# Patient Record
Sex: Female | Born: 1985 | Race: Black or African American | Hispanic: No | Marital: Single | Smoking: Former smoker
Health system: Southern US, Community
[De-identification: ages and names within clinical notes are randomized; demographics above are authoritative.]

## PROBLEM LIST (undated history)

## (undated) DIAGNOSIS — D649 Anemia, unspecified: Secondary | ICD-10-CM

## (undated) DIAGNOSIS — N200 Calculus of kidney: Secondary | ICD-10-CM

---

## 2003-05-19 ENCOUNTER — Emergency Department (HOSPITAL_COMMUNITY): Admission: EM | Admit: 2003-05-19 | Discharge: 2003-05-20 | Payer: Self-pay | Admitting: Emergency Medicine

## 2004-05-13 ENCOUNTER — Inpatient Hospital Stay (HOSPITAL_COMMUNITY): Admission: AD | Admit: 2004-05-13 | Discharge: 2004-05-13 | Payer: Self-pay | Admitting: Obstetrics

## 2004-07-16 ENCOUNTER — Ambulatory Visit (HOSPITAL_COMMUNITY): Admission: RE | Admit: 2004-07-16 | Discharge: 2004-07-16 | Payer: Self-pay | Admitting: Obstetrics & Gynecology

## 2004-09-03 ENCOUNTER — Inpatient Hospital Stay (HOSPITAL_COMMUNITY): Admission: AD | Admit: 2004-09-03 | Discharge: 2004-09-03 | Payer: Self-pay | Admitting: Obstetrics

## 2004-09-22 ENCOUNTER — Inpatient Hospital Stay (HOSPITAL_COMMUNITY): Admission: AD | Admit: 2004-09-22 | Discharge: 2004-09-22 | Payer: Self-pay | Admitting: Obstetrics

## 2004-11-06 ENCOUNTER — Inpatient Hospital Stay (HOSPITAL_COMMUNITY): Admission: AD | Admit: 2004-11-06 | Discharge: 2004-11-09 | Payer: Self-pay | Admitting: Obstetrics & Gynecology

## 2004-12-09 ENCOUNTER — Inpatient Hospital Stay (HOSPITAL_COMMUNITY): Admission: AD | Admit: 2004-12-09 | Discharge: 2004-12-09 | Payer: Self-pay | Admitting: Obstetrics & Gynecology

## 2008-05-29 ENCOUNTER — Emergency Department (HOSPITAL_COMMUNITY): Admission: EM | Admit: 2008-05-29 | Discharge: 2008-05-29 | Payer: Self-pay | Admitting: Family Medicine

## 2008-07-05 ENCOUNTER — Emergency Department (HOSPITAL_COMMUNITY): Admission: EM | Admit: 2008-07-05 | Discharge: 2008-07-05 | Payer: Self-pay | Admitting: Family Medicine

## 2008-10-21 ENCOUNTER — Emergency Department (HOSPITAL_COMMUNITY): Admission: EM | Admit: 2008-10-21 | Discharge: 2008-10-21 | Payer: Self-pay | Admitting: Emergency Medicine

## 2010-03-27 ENCOUNTER — Inpatient Hospital Stay (INDEPENDENT_AMBULATORY_CARE_PROVIDER_SITE_OTHER)
Admission: RE | Admit: 2010-03-27 | Discharge: 2010-03-27 | Disposition: A | Discharge status: Home / Self Care | Payer: Self-pay | Source: Ambulatory Visit | Attending: Family Medicine | Admitting: Family Medicine

## 2010-03-27 DIAGNOSIS — J020 Streptococcal pharyngitis: Secondary | ICD-10-CM

## 2011-10-22 ENCOUNTER — Encounter (HOSPITAL_COMMUNITY): Payer: Self-pay | Admitting: Emergency Medicine

## 2011-10-22 ENCOUNTER — Emergency Department (HOSPITAL_COMMUNITY): Payer: Self-pay

## 2011-10-22 ENCOUNTER — Emergency Department (HOSPITAL_COMMUNITY)
Admission: EM | Admit: 2011-10-22 | Discharge: 2011-10-22 | Disposition: A | Discharge status: Home / Self Care | Payer: Self-pay | Attending: Emergency Medicine | Admitting: Emergency Medicine

## 2011-10-22 DIAGNOSIS — N23 Unspecified renal colic: Secondary | ICD-10-CM | POA: Insufficient documentation

## 2011-10-22 DIAGNOSIS — N1330 Unspecified hydronephrosis: Secondary | ICD-10-CM | POA: Insufficient documentation

## 2011-10-22 LAB — URINALYSIS, ROUTINE W REFLEX MICROSCOPIC
Bilirubin Urine: NEGATIVE
Glucose, UA: NEGATIVE mg/dL
Ketones, ur: NEGATIVE mg/dL
Nitrite: NEGATIVE
Protein, ur: 30 mg/dL — AB
pH: 6 (ref 5.0–8.0)

## 2011-10-22 LAB — POCT PREGNANCY, URINE: Preg Test, Ur: NEGATIVE

## 2011-10-22 LAB — URINE MICROSCOPIC-ADD ON

## 2011-10-22 MED ORDER — MORPHINE SULFATE 4 MG/ML IJ SOLN
4.0000 mg | Freq: Once | INTRAMUSCULAR | Status: AC
  Administered 2011-10-22: 06:00:00 via INTRAVENOUS

## 2011-10-22 MED ORDER — ONDANSETRON HCL 4 MG/2ML IJ SOLN
INTRAMUSCULAR | Status: AC
  Filled 2011-10-22: qty 2

## 2011-10-22 MED ORDER — HYDROMORPHONE HCL PF 1 MG/ML IJ SOLN
1.0000 mg | Freq: Once | INTRAMUSCULAR | Status: AC
  Administered 2011-10-22: 1 mg via INTRAVENOUS
  Filled 2011-10-22: qty 1

## 2011-10-22 MED ORDER — ONDANSETRON HCL 4 MG/2ML IJ SOLN
4.0000 mg | Freq: Once | INTRAMUSCULAR | Status: AC
  Administered 2011-10-22: 4 mg via INTRAVENOUS

## 2011-10-22 MED ORDER — KETOROLAC TROMETHAMINE 30 MG/ML IJ SOLN
30.0000 mg | Freq: Once | INTRAMUSCULAR | Status: AC
  Administered 2011-10-22: 30 mg via INTRAVENOUS
  Filled 2011-10-22: qty 1

## 2011-10-22 MED ORDER — OXYCODONE-ACETAMINOPHEN 5-325 MG PO TABS: 1.0000 | ORAL_TABLET | Freq: Once | ORAL | Status: DC

## 2011-10-22 MED ORDER — PROMETHAZINE HCL 25 MG PO TABS: 25.0000 mg | ORAL_TABLET | Freq: Four times a day (QID) | ORAL | Status: DC | PRN

## 2011-10-22 MED ORDER — ONDANSETRON 4 MG PO TBDP: 4.0000 mg | ORAL_TABLET | Freq: Once | ORAL | Status: DC

## 2011-10-22 MED ORDER — HYDROMORPHONE HCL 4 MG PO TABS: 4.0000 mg | ORAL_TABLET | ORAL | Status: AC | PRN

## 2011-10-22 NOTE — ED Notes (Signed)
PT. WOKE UP THIS MORNING WITH LEFT FLANK PAIN , URINARY FREQUENCY ONSET 2 DAYS AGO , VOMITTED AT TRIAGE .

## 2011-10-22 NOTE — ED Provider Notes (Cosign Needed Addendum)
History     CSN: [id number]  Arrival date & time 10/22/11  0457   First MD Initiated Contact with Patient 10/22/11 [phone]      Chief Complaint  Patient presents with  . Flank Pain    (Consider location/radiation/quality/duration/timing/severity/associated sxs/prior treatment) Patient is a 26 y.o. female presenting with flank pain. The history is provided by the patient. The history is limited by the condition of the patient.  Flank Pain Associated symptoms include abdominal pain.   26 year old, female, complains of sudden onset of left-sided flank pain, associated with nausea, and vomiting.  She denies respiratory symptoms.  She has not had these symptoms in the past.  Level V caveat applies for severe pain and urgent need for intervention  History reviewed. No pertinent past medical history.  History reviewed. No pertinent past surgical history.  No family history on file.  History  Substance Use Topics  . Smoking status: Never Smoker   . Smokeless tobacco: Not on file  . Alcohol Use: Yes    OB History    Grav Para Term Preterm Abortions TAB SAB Ect Mult Living                  Review of Systems  Constitutional: Negative for fever and diaphoresis.  Gastrointestinal: Positive for nausea, vomiting and abdominal pain. Negative for diarrhea.  Genitourinary: Positive for flank pain.  All other systems reviewed and are negative.    Allergies  Review of patient's allergies indicates no known allergies.  Home Medications  No current outpatient prescriptions on file.  BP 120/65  Pulse 89  Temp 98.2 F (36.8 C) (Oral)  Resp 20  SpO2 100%  Physical Exam  Nursing note and vitals reviewed. Constitutional: She is oriented to person, place, and time. She appears well-developed and well-nourished. She appears distressed.       Rocking back-and-forth in pain holding the right side of her abdomen  HENT:  Head: Normocephalic and atraumatic.  Eyes: Conjunctivae are normal.   Neck: Normal range of motion.  Cardiovascular: Normal rate and regular rhythm.   Pulmonary/Chest: Effort normal and breath sounds normal.  Abdominal: She exhibits no distension.  Musculoskeletal: Normal range of motion.  Neurological: She is alert and oriented to person, place, and time.  Skin: Skin is warm and dry.  Psychiatric: She has a normal mood and affect. Thought content normal.    ED Course  Procedures (including critical care time) 26 year old, female, with sudden onset of left sided abdominal pain, and right flank pain, with nausea, and vomiting.  She is rocking back and forth holding her abdomen.  I suspect.  She has a kidney stone.  We will give her IV analgesics, and antiemetics, and perform a CAT scan for further evaluation  Labs Reviewed  URINALYSIS, ROUTINE W REFLEX MICROSCOPIC - Abnormal; Notable for the following:    APPearance CLOUDY (*)     Hgb urine dipstick MODERATE (*)     Protein, ur 30 (*)     Leukocytes, UA SMALL (*)     All other components within normal limits  URINE MICROSCOPIC-ADD ON - Abnormal; Notable for the following:    Crystals CA OXALATE CRYSTALS (*)     All other components within normal limits  POCT PREGNANCY, URINE   No results found.   No diagnosis found.  7:04 AM Pain controlled.  Feels as if she can go home.    MDM  Flank pain Left hydroureter.  Cheri Guppy, MD 10/22/11 1610  Cheri Guppy, MD 10/22/11 0700  Cheri Guppy, MD 10/22/11 [phone]

## 2011-10-22 NOTE — ED Notes (Signed)
IV d/c'd placed by prior shift, catheter intact

## 2011-10-22 NOTE — Discharge Instructions (Signed)
Use ibuprofen 600 mg and Dilaudid for recurrent pain.  Use Phenergan for nausea and vomiting.  Followup with the urologist, for reevaluation.  If your symptoms.  Last more than one to 2 days.  Return for worse or uncontrolled symptoms   .Ureteral Colic (Kidney Stones) Ureteral colic is the result of a condition when kidney stones form inside the kidney. Once kidney stones are formed they may move into the tube that connects the kidney with the bladder (ureter). If this occurs, this condition may cause pain (colic) in the ureter.  CAUSES  Pain is caused by stone movement in the ureter and the obstruction caused by the stone. SYMPTOMS  The pain comes and goes as the ureter contracts around the stone. The pain is usually intense, sharp, and stabbing in character. The location of the pain may move as the stone moves through the ureter. When the stone is near the kidney the pain is usually located in the back and radiates to the belly (abdomen). When the stone is ready to pass into the bladder the pain is often located in the lower abdomen on the side the stone is located. At this location, the symptoms may mimic those of a urinary tract infection with urinary frequency. Once the stone is located here it often passes into the bladder and the pain disappears completely. TREATMENT   Your caregiver will provide you with medicine for pain relief.   You may require specialized follow-up X-rays.   The absence of pain does not always mean that the stone has passed. It may have just stopped moving. If the urine remains completely obstructed, it can cause loss of kidney function or even complete destruction of the involved kidney. It is your responsibility and in your interest that X-rays and follow-ups as suggested by your caregiver are completed. Relief of pain without passage of the stone can be associated with severe damage to the kidney, including loss of kidney function on that side.   If your stone does  not pass on its own, additional measures may be taken by your caregiver to ensure its removal.  HOME CARE INSTRUCTIONS   Increase your fluid intake. Water is the preferred fluid since juices containing vitamin C may acidify the urine making it less likely for certain stones (uric acid stones) to pass.   Strain all urine. A strainer will be provided. Keep all particulate matter or stones for your caregiver to inspect.   Take your pain medicine as directed.   Make a follow-up appointment with your caregiver as directed.   Remember that the goal is passage of your stone. The absence of pain does not mean the stone is gone. Follow your caregiver's instructions.   Only take over-the-counter or prescription medicines for pain, discomfort, or fever as directed by your caregiver.  SEEK MEDICAL CARE IF:   Pain cannot be controlled with the prescribed medicine.   You have a fever.   Pain continues for longer than your caregiver advises it should.   There is a change in the pain, and you develop chest discomfort or constant abdominal pain.   You feel faint or pass out.  MAKE SURE YOU:   Understand these instructions.   Will watch your condition.   Will get help right away if you are not doing well or get worse.  Document Released: 11/13/2004 Document Revised: 01/23/2011 Document Reviewed: 07/31/2010 [facility] Patient Information 2012 [place], [place].

## 2011-10-22 NOTE — ED Notes (Signed)
Took out PIV of left forearm

## 2013-03-18 ENCOUNTER — Emergency Department (HOSPITAL_COMMUNITY)
Admission: EM | Admit: 2013-03-18 | Discharge: 2013-03-18 | Disposition: A | Discharge status: Home / Self Care | Payer: BC Managed Care – PPO | Source: Home / Self Care

## 2013-03-18 ENCOUNTER — Encounter (HOSPITAL_COMMUNITY): Payer: Self-pay | Admitting: Emergency Medicine

## 2013-03-18 DIAGNOSIS — J069 Acute upper respiratory infection, unspecified: Secondary | ICD-10-CM

## 2013-03-18 MED ORDER — GUAIFENESIN-CODEINE 100-10 MG/5ML PO SYRP: 10.0000 mL | ORAL_SOLUTION | Freq: Four times a day (QID) | ORAL | Status: DC | PRN

## 2013-03-18 MED ORDER — IPRATROPIUM BROMIDE 0.06 % NA SOLN: 2.0000 | Freq: Four times a day (QID) | NASAL | Status: DC

## 2013-03-18 NOTE — ED Notes (Signed)
coughin since yesterday, throat scratchy, pain beneath both ears, runny nose, and no fever, no nausea, no vomiting, no diarrhea

## 2013-03-18 NOTE — Discharge Instructions (Signed)
Drink plenty of fluids as discussed, use medicine as prescribed, and mucinex or delsym for cough. Return or see your doctor if further problems °

## 2013-03-18 NOTE — ED Provider Notes (Signed)
CSN: [id number]     Arrival date & time 03/18/13  1806 History   None    Chief Complaint  Patient presents with  . URI   (Consider location/radiation/quality/duration/timing/severity/associated sxs/prior Treatment) Patient is a 28 y.o. female presenting with URI. The history is provided by the patient.  URI Presenting symptoms: congestion, cough and rhinorrhea   Presenting symptoms: no fever   Severity:  Mild Onset quality:  Gradual Duration:  1 day Progression:  Unchanged Chronicity:  New Associated symptoms: no myalgias   Risk factors: sick contacts     History reviewed. No pertinent past medical history. History reviewed. No pertinent past surgical history. No family history on file. History  Substance Use Topics  . Smoking status: Never Smoker   . Smokeless tobacco: Not on file  . Alcohol Use: Yes   OB History   Grav Para Term Preterm Abortions TAB SAB Ect Mult Living                 Review of Systems  Constitutional: Negative for fever and chills.  HENT: Positive for congestion, postnasal drip and rhinorrhea.   Respiratory: Positive for cough. Negative for shortness of breath.   Cardiovascular: Negative.   Gastrointestinal: Negative.   Musculoskeletal: Negative for myalgias.    Allergies  Review of patient's allergies indicates no known allergies.  Home Medications   Current Outpatient Rx  Name  Route  Sig  Dispense  Refill  . Pseudoephedrine-APAP-DM (DAYQUIL PO)   Oral   Take by mouth.         Marland Kitchen. guaiFENesin-codeine (ROBITUSSIN AC) 100-10 MG/5ML syrup   Oral   Take 10 mLs by mouth 4 (four) times daily as needed for cough.   180 mL   0   . ipratropium (ATROVENT) 0.06 % nasal spray   Each Nare   Place 2 sprays into both nostrils 4 (four) times daily.   15 mL   2   . EXPIRED: promethazine (PHENERGAN) 25 MG tablet   Oral   Take 1 tablet (25 mg total) by mouth every 6 (six) hours as needed for nausea.   20 tablet   0    BP 120/80  Pulse 88   Temp(Src) 98.3 F (36.8 C) (Oral)  Resp 16  SpO2 100% Physical Exam  Nursing note and vitals reviewed. Constitutional: She is oriented to person, place, and time. She appears well-developed and well-nourished.  HENT:  Head: Normocephalic.  Right Ear: External ear normal.  Left Ear: External ear normal.  Mouth/Throat: Oropharynx is clear and moist.  Eyes: Pupils are equal, round, and reactive to light.  Neck: Normal range of motion. Neck supple.  Cardiovascular: Normal rate, regular rhythm, normal heart sounds and intact distal pulses.   Pulmonary/Chest: Effort normal and breath sounds normal.  Lymphadenopathy:    She has no cervical adenopathy.  Neurological: She is alert and oriented to person, place, and time.  Skin: Skin is warm and dry.    ED Course  Procedures (including critical care time) Labs Review Labs Reviewed - No data to display Imaging Review No results found.    MDM      Linna HoffJames D Caryl Manas, MD 03/30/13 2228

## 2013-10-14 ENCOUNTER — Ambulatory Visit (INDEPENDENT_AMBULATORY_CARE_PROVIDER_SITE_OTHER): Payer: Federal, State, Local not specified - PPO | Admitting: Physician Assistant

## 2013-10-14 VITALS — BP 108/68 | HR 71 | Temp 98.7°F | Resp 17 | Ht 66.5 in | Wt 134.0 lb

## 2013-10-14 DIAGNOSIS — H00013 Hordeolum externum right eye, unspecified eyelid: Secondary | ICD-10-CM

## 2013-10-14 DIAGNOSIS — H00019 Hordeolum externum unspecified eye, unspecified eyelid: Secondary | ICD-10-CM

## 2013-10-14 NOTE — Patient Instructions (Signed)
Warm compresses. Baby shampoo - johnson-johnson

## 2013-10-14 NOTE — Progress Notes (Signed)
   Subjective:    Patient ID: Meghan [name], female    DOB: [birth year], 28 y.o.   MRN: [id number]  HPI Pt presents to clinic with 4 day h/o swelling on right upper eyelid that seems to be getting worse.  She tried some warm compresses but does not think they are helping.  She is having no pain in her eyeball and no vision changes for discharge from her eye.  She otherwise feels fine. She came in today because she got a small know near her right ear that is tender.  She wears glasses.  Review of Systems  Eyes: Negative for photophobia, pain, discharge, redness, itching and visual disturbance.       Objective:   Physical Exam  Vitals reviewed. Constitutional: She appears well-developed and well-nourished.  HENT:  Head: Normocephalic and atraumatic.  Right Ear: External ear normal.  Eyes: Conjunctivae and EOM are normal. Pupils are equal, round, and reactive to light. Right eye exhibits hordeolum. Left eye exhibits no chemosis, no discharge, no exudate and no hordeolum. No foreign body present in the left eye. Right conjunctiva is not injected. Right conjunctiva has no hemorrhage. Left conjunctiva is not injected. Left conjunctiva has no hemorrhage. No scleral icterus.    Slight inversion of eyelid did not reveal a pore or pustule.  Neck: Normal range of motion.  Pulmonary/Chest: Effort normal.  Lymphadenopathy:       Head (right side): Preauricular adenopathy present.       Head (left side): No preauricular adenopathy present.  Skin: Skin is warm and dry.  Psychiatric: She has a normal mood and affect. Her behavior is normal. Judgment and thought content normal.       Assessment & Plan:  Hordeolum externum (stye), right  This is most likely a stye but because it is slightly off the eyelid border we will have a low threshold for possible infected chlazion. For the next 48 hrs pt is going to be aggressive with warm compresses and gentle friction with baby shampoo washes.  If after  48 hrs she is not improving we will start oral abx and then after 48h if no better we will do a referral to ophthamology for evaluation because she is leaving town in 6 days.  She understands the plan and agrees.  Benny Lennert PA-C  [facility] 10/14/2013 7:37 PM

## 2013-10-16 ENCOUNTER — Telehealth: Payer: Self-pay

## 2013-10-16 ENCOUNTER — Encounter: Payer: Self-pay | Admitting: Physician Assistant

## 2013-10-16 MED ORDER — AMOXICILLIN 875 MG PO TABS: 875.0000 mg | ORAL_TABLET | Freq: Two times a day (BID) | ORAL | Status: DC

## 2013-10-16 NOTE — Telephone Encounter (Signed)
Patient was advised to call our office if warm compresses and baby shampoo did not cause improvement with her stye. Patient says Meghan [name] said she would call in eye drops

## 2013-10-16 NOTE — Telephone Encounter (Signed)
See email. Pt notified that abx was sent to pharm.

## 2013-10-16 NOTE — Telephone Encounter (Signed)
I am so sorry it has not gotten any better.  Lets try an oral abx to see if we can get it to get better faster - if no better in 48h please call me for a referral.

## 2013-10-24 ENCOUNTER — Encounter: Payer: Self-pay | Admitting: Physician Assistant

## 2013-10-24 DIAGNOSIS — H00019 Hordeolum externum unspecified eye, unspecified eyelid: Secondary | ICD-10-CM

## 2013-10-25 NOTE — Telephone Encounter (Signed)
Pt email to request referral. Placed order per last OV with Benny Lennert

## 2013-12-02 ENCOUNTER — Other Ambulatory Visit: Payer: Self-pay

## 2014-06-11 ENCOUNTER — Emergency Department (INDEPENDENT_AMBULATORY_CARE_PROVIDER_SITE_OTHER)
Admission: EM | Admit: 2014-06-11 | Discharge: 2014-06-11 | Disposition: A | Discharge status: Home / Self Care | Payer: Federal, State, Local not specified - PPO | Source: Home / Self Care

## 2014-06-11 ENCOUNTER — Encounter (HOSPITAL_COMMUNITY): Payer: Self-pay | Admitting: Emergency Medicine

## 2014-06-11 DIAGNOSIS — B349 Viral infection, unspecified: Secondary | ICD-10-CM

## 2014-06-11 MED ORDER — HYDROCODONE-HOMATROPINE 5-1.5 MG/5ML PO SYRP
5.0000 mL | ORAL_SOLUTION | Freq: Four times a day (QID) | ORAL | Status: DC | PRN
Start: 2014-06-11 — End: 2018-09-16

## 2014-06-11 NOTE — ED Provider Notes (Signed)
CSN: [id number]     Arrival date & time 06/11/14  4098 History   None    Chief Complaint  Patient presents with  . URI   (Consider location/radiation/quality/duration/timing/severity/associated sxs/prior Treatment)  HPI   Patient is a 29 year old female presenting today with complaints of bodyaches sweating and a "phlegmy cough", along with a low-grade fever for the past 2 days. Patient states she has been taking over-the-counter cough medicine Robitussin-DM and intermittently ibuprofen without relief. Last dose of ibuprofen was 8:00 yesterday evening. She states she states she came today is that she is not happy with "all of the coughing" and "never gets sick".  History reviewed. No pertinent past medical history. History reviewed. No pertinent past surgical history. Family History  Problem Relation Age of Onset  . Hyperlipidemia Father    History  Substance Use Topics  . Smoking status: Never Smoker   . Smokeless tobacco: Not on file  . Alcohol Use: Yes   OB History    No data available     Review of Systems  Constitutional: Positive for fever, chills, appetite change and fatigue.  HENT: Positive for congestion. Negative for ear pain, sinus pressure and sore throat.   Eyes: Negative.  Negative for photophobia.  Cardiovascular: Negative.   Gastrointestinal: Negative.  Negative for nausea, vomiting, abdominal pain and diarrhea.  Endocrine: Negative.   Genitourinary: Negative.   Musculoskeletal: Positive for myalgias. Negative for neck pain and neck stiffness.  Skin: Negative.  Negative for rash.  Allergic/Immunologic: Negative.   Hematological: Negative.   Psychiatric/Behavioral: Negative.     Allergies  Review of patient's allergies indicates no known allergies.  Home Medications   Prior to Admission medications   Medication Sig Start Date End Date Taking? Authorizing Provider  amoxicillin (AMOXIL) 875 MG tablet Take 1 tablet (875 mg total) by mouth 2 (two) times  daily. 10/16/13   Morrell Riddle, PA-C  HYDROcodone-homatropine (HYCODAN) 5-1.5 MG/5ML syrup Take 5 mLs by mouth every 6 (six) hours as needed for cough. 06/11/14   Servando Salina, NP   BP 116/75 mmHg  Pulse 72  Temp(Src) 99 F (37.2 C) (Oral)  Resp 16  SpO2 98%   Physical Exam  Constitutional: She appears well-developed and well-nourished. No distress.  HENT:  Head: Normocephalic and atraumatic.  Bilateral tympanic membranes pearly gray in appearance with light reflexes present and bony prominences visualized.  Nares patent.  Moderate swelling of turbinates. Posterior oropharynx with mild erythema. Mucoid discharge noted. No cobblestoning. No evidence of exudate or patches.    Eyes: Pupils are equal, round, and reactive to light. Right eye exhibits no discharge. Left eye exhibits no discharge.  Neck: Normal range of motion. Neck supple.  Negative for nuchal rigidity.  Cardiovascular: Normal rate, regular rhythm, normal heart sounds and intact distal pulses.  Exam reveals no gallop and no friction rub.   No murmur heard. Pulmonary/Chest: Effort normal and breath sounds normal. No respiratory distress. She has no wheezes. She has no rales. She exhibits no tenderness.  No adventitious breath sounds noted  Lymphadenopathy:    She has no cervical adenopathy.  Skin: She is not diaphoretic.  Nursing note and vitals reviewed. The does not appear to be particularly ill. Patient able to take good full deep breaths without coughing or any distress.  ED Course  Procedures (including critical care time) Labs Review Labs Reviewed - No data to display  Imaging Review No results found.   MDM   1. Viral illness  Meds ordered this encounter  Medications  . HYDROcodone-homatropine (HYCODAN) 5-1.5 MG/5ML syrup    Sig: Take 5 mLs by mouth every 6 (six) hours as needed for cough.    Dispense:  120 mL    Refill:  0   Discussed patient needed to try regular supportive measures such as  fluid, rest and over-the-counter Tylenol or ibuprofen as needed. Patient requests prescription cough medicine to help her rest. The patient verbalizes understanding and agrees to plan of care.       Servando Salinaatherine H Rossi, NP 06/11/14 1213

## 2014-06-11 NOTE — Discharge Instructions (Signed)
Cough, Adult  A cough is a reflex that helps clear your throat and airways. It can help heal the body or may be a reaction to an irritated airway. A cough may only last 2 or 3 weeks (acute) or may last more than 8 weeks (chronic).  CAUSES Acute cough:  Viral or bacterial infections. Chronic cough:  Infections.  Allergies.  Asthma.  Post-nasal drip.  Smoking.  Heartburn or acid reflux.  Some medicines.  Chronic lung problems (COPD).  Cancer. SYMPTOMS   Cough.  Fever.  Chest pain.  Increased breathing rate.  High-pitched whistling sound when breathing (wheezing).  Colored mucus that you cough up (sputum). TREATMENT   A bacterial cough may be treated with antibiotic medicine.  A viral cough must run its course and will not respond to antibiotics.  Your caregiver may recommend other treatments if you have a chronic cough. HOME CARE INSTRUCTIONS   Only take over-the-counter or prescription medicines for pain, discomfort, or fever as directed by your caregiver. Use cough suppressants only as directed by your caregiver.  Use a cold steam vaporizer or humidifier in your bedroom or home to help loosen secretions.  Sleep in a semi-upright position if your cough is worse at night.  Rest as needed.  Stop smoking if you smoke. SEEK IMMEDIATE MEDICAL CARE IF:   You have pus in your sputum.  Your cough starts to worsen.  You cannot control your cough with suppressants and are losing sleep.  You begin coughing up blood.  You have difficulty breathing.  You develop pain which is getting worse or is uncontrolled with medicine.  You have a fever. MAKE SURE YOU:   Understand these instructions.  Will watch your condition.  Will get help right away if you are not doing well or get worse. Document Released: 08/02/2010 Document Revised: 04/28/2011 Document Reviewed: 08/02/2010 [employer] Patient Information 2015 [employer]. This information is not intended  to replace advice given to you by your health care provider. Make sure you discuss any questions you have with your health care provider.  

## 2014-06-11 NOTE — ED Notes (Signed)
C/o  Productive cough with clear sputum.  Low grade temp.  Body aches.  Chills.  Symptoms present since Friday.  Denies n/v/d.   No relief with otc meds.

## 2018-09-16 ENCOUNTER — Other Ambulatory Visit: Payer: Self-pay

## 2018-09-16 ENCOUNTER — Ambulatory Visit (HOSPITAL_COMMUNITY)
Admission: EM | Admit: 2018-09-16 | Discharge: 2018-09-16 | Disposition: A | Discharge status: Home / Self Care | Payer: Federal, State, Local not specified - PPO | Attending: Family Medicine | Admitting: Family Medicine

## 2018-09-16 ENCOUNTER — Encounter (HOSPITAL_COMMUNITY): Payer: Self-pay

## 2018-09-16 DIAGNOSIS — R109 Unspecified abdominal pain: Secondary | ICD-10-CM | POA: Diagnosis not present

## 2018-09-16 DIAGNOSIS — R8281 Pyuria: Secondary | ICD-10-CM

## 2018-09-16 HISTORY — DX: Calculus of kidney: N20.0

## 2018-09-16 LAB — POCT URINALYSIS DIP (DEVICE)
Bilirubin Urine: NEGATIVE
Glucose, UA: NEGATIVE mg/dL
Hgb urine dipstick: NEGATIVE
Ketones, ur: NEGATIVE mg/dL
Nitrite: NEGATIVE
Protein, ur: NEGATIVE mg/dL
Specific Gravity, Urine: 1.025 (ref 1.005–1.030)
Urobilinogen, UA: 0.2 mg/dL (ref 0.0–1.0)
pH: 6.5 (ref 5.0–8.0)

## 2018-09-16 MED ORDER — CEPHALEXIN 500 MG PO CAPS: 500.0000 mg | ORAL_CAPSULE | Freq: Four times a day (QID) | ORAL | 0 refills | Status: DC

## 2018-09-16 MED ORDER — KETOROLAC TROMETHAMINE 60 MG/2ML IM SOLN
60.0000 mg | Freq: Once | INTRAMUSCULAR | Status: AC
  Administered 2018-09-16: 60 mg via INTRAMUSCULAR

## 2018-09-16 MED ORDER — KETOROLAC TROMETHAMINE 60 MG/2ML IM SOLN
INTRAMUSCULAR | Status: AC
  Filled 2018-09-16: qty 2

## 2018-09-16 NOTE — ED Triage Notes (Signed)
Pt presents with left side flank pain since Monday.  Pt has history of kidney stones

## 2018-09-16 NOTE — ED Provider Notes (Signed)
[facility]    CSN: [id number] Arrival date & time: 09/16/18  1824     History   Chief Complaint Chief Complaint  Patient presents with   Flank Pain    HPI Meghan [name] is a 33 y.o. female.   33 yo woman, initial [facility] patient, presents for evaluation for left kidney stone because of flank pain x 3 days.  She has a h/o left kidney stone in the past(2013).  She was awakened by left flank pain on Monday.  The pain is positional and she has some nausea.  No vomiting or fever.  Patient works from home.     Past Medical History:  Diagnosis Date   Kidney stones     There are no active problems to display for this patient.   History reviewed. No pertinent surgical history.  OB History   No obstetric history on file.      Home Medications    Prior to Admission medications   Medication Sig Start Date End Date Taking? Authorizing Provider  cephALEXin (KEFLEX) 500 MG capsule Take 1 capsule (500 mg total) by mouth 4 (four) times daily. 09/16/18   Elvina SidleLauenstein, Ozie Dimaria, MD    Family History Family History  Problem Relation Age of Onset   Hyperlipidemia Father     Social History Social History   Tobacco Use   Smoking status: Never Smoker  Substance Use Topics   Alcohol use: Yes   Drug use: No     Allergies   Patient has no known allergies.   Review of Systems Review of Systems  Constitutional: Negative.   Gastrointestinal: Positive for nausea. Negative for vomiting.  Genitourinary: Positive for flank pain. Negative for hematuria.     Physical Exam Triage Vital Signs ED Triage Vitals  Enc Vitals Group     BP      Pulse      Resp      Temp      Temp src      SpO2      Weight      Height      Head Circumference      Peak Flow      Pain Score      Pain Loc      Pain Edu?      Excl. in GC?    No data found.  Updated Vital Signs BP 125/81 (BP Location: Right Arm)    Pulse 66    Temp 99 F (37.2 C) (Oral)    Resp 18     SpO2 99%   Physical Exam Vitals signs and nursing note reviewed.  Constitutional:      Appearance: Normal appearance.  HENT:     Head: Normocephalic.     Mouth/Throat:     Pharynx: Oropharynx is clear.  Eyes:     Conjunctiva/sclera: Conjunctivae normal.  Neck:     Musculoskeletal: Normal range of motion and neck supple.  Pulmonary:     Effort: Pulmonary effort is normal.  Abdominal:     Tenderness: There is left CVA tenderness.  Musculoskeletal: Normal range of motion.  Skin:    General: Skin is warm and dry.  Neurological:     General: No focal deficit present.     Mental Status: She is alert.  Psychiatric:        Mood and Affect: Mood normal.      UC Treatments / Results  Labs (all labs ordered are listed, but only abnormal results  are displayed) Labs Reviewed  POCT URINALYSIS DIP (DEVICE) - Abnormal; Notable for the following components:      Result Value   Leukocytes,Ua TRACE (*)    All other components within normal limits  URINE CULTURE    EKG   Radiology *RADIOLOGY REPORT*  10/22/2011  Clinical Data: Left flank and back pain, nausea, vomiting, similar episode 4 days ago with hematuria, possible UTI or patient  CT ABDOMEN AND PELVIS WITHOUT CONTRAST  Technique:  Multidetector CT imaging of the abdomen and pelvis was performed following the standard protocol without intravenous contrast. Sagittal and coronal MPR images reconstructed from axial data set.  Comparison: None  Findings: Lung bases clear. Bilateral small nonobstructing renal calculi. Mild left hydronephrosis and proximal ureteral dilatation. Distally the left ureter is difficult to visualize due to lack of adequate fat planes. No definite distal ureteral calcification is identified. Within limits of a nonenhanced exam, no focal abnormalities of the liver, spleen, pancreas, right kidney, or adrenal glands identified. Left kidney appears mildly enlarged versus the right kidney  without focal mass.  Grossly unremarkable bladder, uterus and adnexae. Normal appendix. No gross abnormalities of large or small bowel loops are identified on exam limited by lack of IV and oral contrast. No definite mass, adenopathy, free fluid, or free air. No acute osseous findings.  IMPRESSION: Bilateral nonobstructing small renal calculi. Mild left hydronephrosis and proximal hydroureter without definite visualization of a ureteral calculus. Minimal asymmetric enlargement of left kidney versus right.   Original Report Authenticated By: Burnetta Sabin, M.D.  10/22/2011 Procedures Procedures (including critical care time)  Medications Ordered in UC Medications  ketorolac (TORADOL) injection 60 mg (has no administration in time range)    Initial Impression / Assessment and Plan / UC Course  I have reviewed the triage vital signs and the nursing notes.  Pertinent labs & imaging results that were available during my care of the patient were reviewed by me and considered in my medical decision making (see chart for details).    Final Clinical Impressions(s) / UC Diagnoses   Final diagnoses:  Pyuria  Acute left flank pain     Discharge Instructions     The urine shows no blood, but it does show some signs of infection.  We are running a culture.  I have electronically ordered an antibiotic for your to start tonight.  If the pain does not improve in 24 hours, or it worsens, go to the emergency room for imaging.    ED Prescriptions    Medication Sig Dispense Auth. Provider   cephALEXin (KEFLEX) 500 MG capsule Take 1 capsule (500 mg total) by mouth 4 (four) times daily. 20 capsule Robyn Haber, MD     Controlled Substance Prescriptions [place] Controlled Substance Registry consulted? Not Applicable   Robyn Haber, MD 09/16/18 [phone]

## 2018-09-16 NOTE — Discharge Instructions (Addendum)
The urine shows no blood, but it does show some signs of infection.  We are running a culture.  I have electronically ordered an antibiotic for your to start tonight.  If the pain does not improve in 24 hours, or it worsens, go to the emergency room for imaging.

## 2018-09-18 LAB — URINE CULTURE: Culture: NO GROWTH

## 2018-11-11 ENCOUNTER — Other Ambulatory Visit: Payer: Self-pay

## 2018-11-12 ENCOUNTER — Ambulatory Visit (INDEPENDENT_AMBULATORY_CARE_PROVIDER_SITE_OTHER): Payer: Federal, State, Local not specified - PPO | Admitting: Obstetrics & Gynecology

## 2018-11-12 ENCOUNTER — Telehealth: Payer: Self-pay | Admitting: *Deleted

## 2018-11-12 ENCOUNTER — Encounter: Payer: Self-pay | Admitting: Obstetrics & Gynecology

## 2018-11-12 VITALS — BP 118/76 | Ht 65.0 in | Wt 135.0 lb

## 2018-11-12 DIAGNOSIS — Z1151 Encounter for screening for human papillomavirus (HPV): Secondary | ICD-10-CM

## 2018-11-12 DIAGNOSIS — Z01419 Encounter for gynecological examination (general) (routine) without abnormal findings: Secondary | ICD-10-CM

## 2018-11-12 DIAGNOSIS — Z3009 Encounter for other general counseling and advice on contraception: Secondary | ICD-10-CM

## 2018-11-12 DIAGNOSIS — N920 Excessive and frequent menstruation with regular cycle: Secondary | ICD-10-CM | POA: Diagnosis not present

## 2018-11-12 DIAGNOSIS — N6320 Unspecified lump in the left breast, unspecified quadrant: Secondary | ICD-10-CM

## 2018-11-12 DIAGNOSIS — Z113 Encounter for screening for infections with a predominantly sexual mode of transmission: Secondary | ICD-10-CM | POA: Diagnosis not present

## 2018-11-12 NOTE — Telephone Encounter (Signed)
-----   Message from Princess Bruins, MD sent at 11/12/2018 11:55 AM EDT ----- Regarding: Schedule Left Dx Mammo/US Left breast nodule at 7 O'Clock: 1.5 x 2 cm, mobile, NT.  Lt Dx Mammo/US.

## 2018-11-12 NOTE — Progress Notes (Signed)
Meghan [name] 05/30/1985 [id number]   History:    33 y.o. G4P2A2L2  Children 105 and 59 yo.  Boyfriend x 8 months.    RP:  New patient presenting for annual gyn exam   HPI: Menses regular every month, but heavy flow x 7-8 days.  Long standing heavy menses previously treated with DepoProvera, took it x 6 yrs until 2018.  Tried again with Depo-Provera for 2 shots in 2019 but her bleeding did not stop, therefore she did not have additional injections.  Per patient, had a normal Pelvic US 12/2017.  Tried BCPs without success in controlling her heavy menstrual flow.  No pelvic pain currently.  Normal vaginal secretions.  No pain with intercourse.  Using condoms.  Urine and bowel movements normal.  Breasts normal.  Body mass index 22.47.  Good fitness.  Healthy nutrition.  Past medical history,surgical history, family history and social history were all reviewed and documented in the EPIC chart.  Gynecologic History Patient's last menstrual period was 11/05/2018. Contraception: condoms Last Pap: 2019. Results were: normal per patient Last mammogram: Never Bone Density: Never Colonoscopy: Never  Obstetric History OB History  Gravida Para Term Preterm AB Living  4 2     2 2   SAB TAB Ectopic Multiple Live Births  2            # Outcome Date GA Lbr Len/2nd Weight Sex Delivery Anes PTL Lv  4 SAB           3 SAB           2 Para           1 Para              ROS: A ROS was performed and pertinent positives and negatives are included in the history.  GENERAL: No fevers or chills. HEENT: No change in vision, no earache, sore throat or sinus congestion. NECK: No pain or stiffness. CARDIOVASCULAR: No chest pain or pressure. No palpitations. PULMONARY: No shortness of breath, cough or wheeze. GASTROINTESTINAL: No abdominal pain, nausea, vomiting or diarrhea, melena or bright red blood per rectum. GENITOURINARY: No urinary frequency, urgency, hesitancy or dysuria. MUSCULOSKELETAL: No joint or  muscle pain, no back pain, no recent trauma. DERMATOLOGIC: No rash, no itching, no lesions. ENDOCRINE: No polyuria, polydipsia, no heat or cold intolerance. No recent change in weight. HEMATOLOGICAL: No anemia or easy bruising or bleeding. NEUROLOGIC: No headache, seizures, numbness, tingling or weakness. PSYCHIATRIC: No depression, no loss of interest in normal activity or change in sleep pattern.     Exam:   BP 118/76   Ht 5\' 5"  (1.651 m)   Wt 135 lb (61.2 kg)   LMP 11/05/2018   BMI 22.47 kg/m   Body mass index is 22.47 kg/m.  General appearance : Well developed well nourished female. No acute distress HEENT: Eyes: no retinal hemorrhage or exudates,  Neck supple, trachea midline, no carotid bruits, no thyroidmegaly Lungs: Clear to auscultation, no rhonchi or wheezes, or rib retractions  Heart: Regular rate and rhythm, no murmurs or gallops Breast:Examined in sitting and supine position were symmetrical in appearance.  Rt breast no palpable masses or tenderness, Lt breast nodule at 7 O'Clock 1.5 x 2 cm mobile, NT.  Bilaterally, no skin retraction, no nipple inversion, no nipple discharge, no skin discoloration, no axillary or supraclavicular lymphadenopathy Abdomen: no palpable masses or tenderness, no rebound or guarding Extremities: no edema or skin discoloration or tenderness  Pelvic:  Vulva: Normal             Vagina: No gross lesions or discharge  Cervix: No gross lesions or discharge.  Pap/HPV HR, Gono-Chlam done.  Uterus  AV, normal size, shape and consistency, non-tender and mobile  Adnexa  Without masses or tenderness  Anus: Normal   Assessment/Plan:  33 y.o. female for annual exam   1. Encounter for routine gynecological examination with Papanicolaou smear of cervix Normal gynecologic exam.  Pap test with high-risk HPV done today.  Breast exam, right breast normal, left breast with a nodule.  Good body mass index at 22.47.  Continue with fitness and healthy nutrition.   2. Menorrhagia with regular cycle Longstanding heavy menstrual.  With history of secondary anemia.  Normal gynecologic exam today.  Will complete investigation with a pelvic ultrasound at follow-up and CBC with TSH today.  Hormonal management of heavy menses as well as surgical treatment per results reviewed with patient.  Considering progesterone IUD.  Will make final decision at follow-up with all results available. - CBC - TSH - US Transvaginal Non-OB; Future  3. Encounter for counseling regarding contraception Condoms strongly recommended until additional contraception is started.  Considering Mirena IUD, insertion, benefits and risks thoroughly reviewed with patient.  May also try again with Depo-Provera injections.  4. Left breast lump Probable fibrocystic breasts.  1.5 x 2 cm mobile, NT nodule at 7 O'Clock.  Will schedule a left Dx Mammo/US.  5. Screen for STD (sexually transmitted disease) Strict condom use recommended - Gono-Chlam on Pap - HIV antibody (with reflex) - RPR - Hepatitis C Antibody - Hepatitis B Surface AntiGEN  Other orders - Multiple Vitamin (MULTIVITAMIN) tablet; Take 1 tablet by mouth daily. - vitamin B-12 (CYANOCOBALAMIN) 500 MCG tablet; Take 500 mcg by mouth daily.  Counseling on above issues and coordination of care more than 50% for 20 minutes.  Princess Bruins MD, 11:19 AM 11/12/2018

## 2018-11-13 ENCOUNTER — Encounter: Payer: Self-pay | Admitting: Obstetrics & Gynecology

## 2018-11-13 NOTE — Patient Instructions (Signed)
1. Encounter for routine gynecological examination with Papanicolaou smear of cervix Normal gynecologic exam.  Pap test with high-risk HPV done today.  Breast exam, right breast normal, left breast with a nodule.  Good body mass index at 22.47.  Continue with fitness and healthy nutrition.  2. Menorrhagia with regular cycle Longstanding heavy menstrual.  With history of secondary anemia.  Normal gynecologic exam today.  Will complete investigation with a pelvic ultrasound at follow-up and CBC with TSH today.  Hormonal management of heavy menses as well as surgical treatment per results reviewed with patient.  Considering progesterone IUD.  Will make final decision at follow-up with all results available. - CBC - TSH - US Transvaginal Non-OB; Future  3. Encounter for counseling regarding contraception Condoms strongly recommended until additional contraception is started.  Considering Mirena IUD, insertion, benefits and risks thoroughly reviewed with patient.  May also try again with Depo-Provera injections.  4. Left breast lump Probable fibrocystic breasts.  1.5 x 2 cm mobile, NT nodule at 7 O'Clock.  Will schedule a left Dx Mammo/US.  5. Screen for STD (sexually transmitted disease) Strict condom use recommended - Gono-Chlam on Pap - HIV antibody (with reflex) - RPR - Hepatitis C Antibody - Hepatitis B Surface AntiGEN  Other orders - Multiple Vitamin (MULTIVITAMIN) tablet; Take 1 tablet by mouth daily. - vitamin B-12 (CYANOCOBALAMIN) 500 MCG tablet; Take 500 mcg by mouth daily.  Meghan [name], it was a pleasure meeting you today!  I will inform you of your results as soon as they are available.

## 2018-11-15 LAB — CBC
HCT: 37.6 % (ref 35.0–45.0)
Hemoglobin: 12.9 g/dL (ref 11.7–15.5)
MCH: 32.8 pg (ref 27.0–33.0)
MCHC: 34.3 g/dL (ref 32.0–36.0)
MCV: 95.7 fL (ref 80.0–100.0)
MPV: 9.7 fL (ref 7.5–12.5)
Platelets: 325 10*3/uL (ref 140–400)
RBC: 3.93 10*6/uL (ref 3.80–5.10)
RDW: 12.5 % (ref 11.0–15.0)
WBC: 6.6 10*3/uL (ref 3.8–10.8)

## 2018-11-15 LAB — TSH: TSH: 1.22 mIU/L

## 2018-11-15 LAB — RPR: RPR Ser Ql: NONREACTIVE

## 2018-11-15 LAB — HIV ANTIBODY (ROUTINE TESTING W REFLEX): HIV 1&2 Ab, 4th Generation: NONREACTIVE

## 2018-11-15 LAB — HEPATITIS C ANTIBODY
Hepatitis C Ab: NONREACTIVE
SIGNAL TO CUT-OFF: 0.01 (ref <1.00)

## 2018-11-15 LAB — HEPATITIS B SURFACE ANTIGEN: Hepatitis B Surface Ag: NONREACTIVE

## 2018-11-15 NOTE — Telephone Encounter (Signed)
Patient scheduled on 11/22/18 @ 7:40am at the breast center

## 2018-11-17 LAB — PAP IG, CT-NG NAA, HPV HIGH-RISK
C. trachomatis RNA, TMA: NOT DETECTED
HPV DNA High Risk: NOT DETECTED
N. gonorrhoeae RNA, TMA: NOT DETECTED

## 2018-11-22 ENCOUNTER — Ambulatory Visit
Admission: RE | Admit: 2018-11-22 | Discharge: 2018-11-22 | Disposition: A | Discharge status: Home / Self Care | Payer: Federal, State, Local not specified - PPO | Source: Ambulatory Visit | Attending: Obstetrics & Gynecology | Admitting: Obstetrics & Gynecology

## 2018-11-22 ENCOUNTER — Other Ambulatory Visit: Payer: Self-pay

## 2018-11-22 DIAGNOSIS — N6320 Unspecified lump in the left breast, unspecified quadrant: Secondary | ICD-10-CM

## 2018-11-30 ENCOUNTER — Ambulatory Visit: Payer: Federal, State, Local not specified - PPO | Admitting: Obstetrics & Gynecology

## 2018-11-30 ENCOUNTER — Other Ambulatory Visit: Payer: Federal, State, Local not specified - PPO

## 2020-02-25 IMAGING — US US BREAST*L* LIMITED INC AXILLA
1 series · 4 of 4 positions shown · non-contrast
Comparison: None available.

CLINICAL DATA: Area of palpable concern in patient's left breast,
lower inner quadrant, felt clinically.

EXAM:
DIGITAL DIAGNOSTIC BILATERAL MAMMOGRAM WITH CAD AND TOMO
ULTRASOUND LEFT BREAST

[Series 1: us breast*left* limited inc axilla · 0.06mm/px · 4 of 4 slices shown]
[im 1/4]
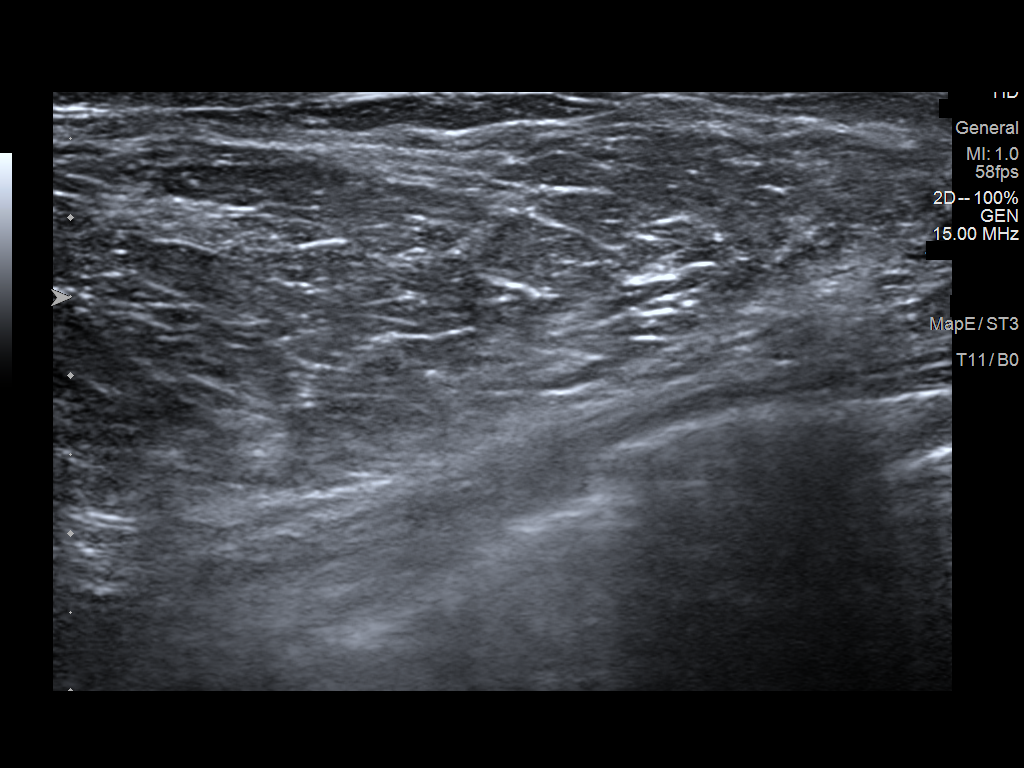
[im 2/4]
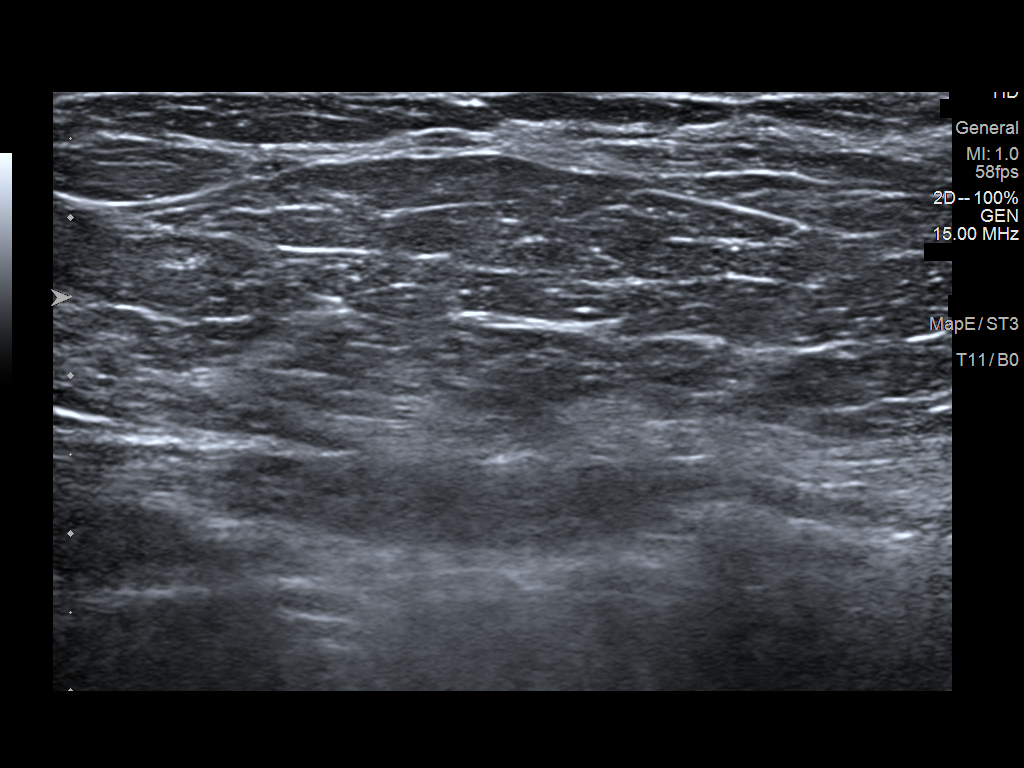
[im 3/4]
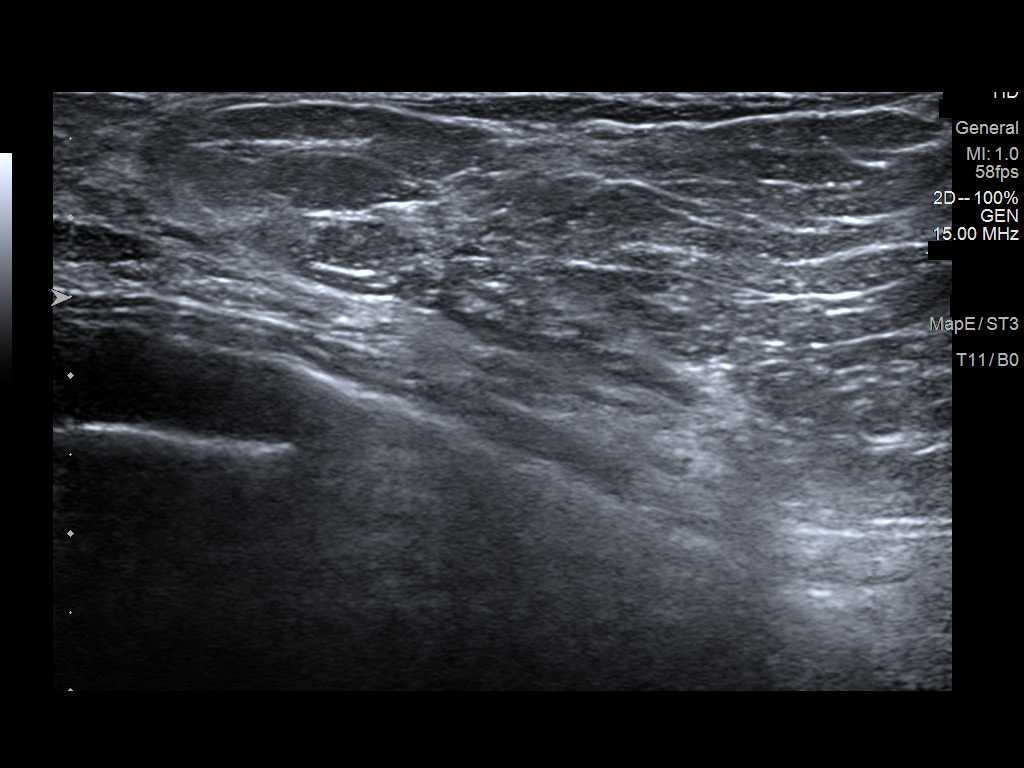
[im 4/4]
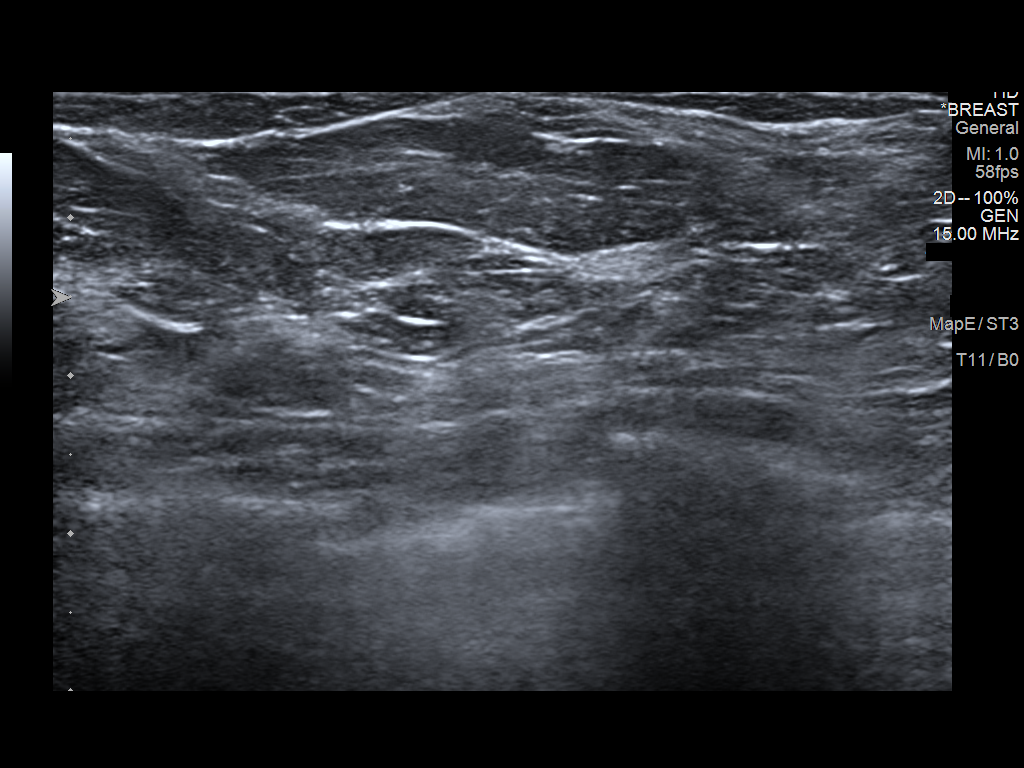

[4 of 4 positions shown; findings below may reference images not displayed]

ACR Breast Density Category c: The breast tissue is heterogeneously
dense, which may obscure small masses.
FINDINGS: Mammographically, there are no suspicious masses, areas of
architectural distortion or microcalcifications in either breast.
The area of palpable concern in patient's left breast is marked with
a BB.

Mammographic images were processed with CAD.

On physical exam, no suspicious masses are palpated in the lower
inner left breast.

Targeted ultrasound is performed, showing no suspicious masses or
shadowing lesions.
IMPRESSION: No mammographic or sonographic evidence of breast malignancy.

RECOMMENDATION:
Further management of patient's area of palpable concern in her left
breast should be based on clinical grounds.

Otherwise, Screening mammogram at age 38 unless there are persistent
or intervening clinical concerns. (Code:O0-8-AGO)

I have discussed the findings and recommendations with the patient.
If applicable, a reminder letter will be sent to the patient
regarding the next appointment.

BI-RADS CATEGORY  1: Negative.

## 2020-02-26 ENCOUNTER — Other Ambulatory Visit: Payer: Self-pay

## 2020-02-26 ENCOUNTER — Encounter (HOSPITAL_COMMUNITY): Payer: Self-pay | Admitting: *Deleted

## 2020-02-26 ENCOUNTER — Ambulatory Visit (HOSPITAL_COMMUNITY)
Admission: EM | Admit: 2020-02-26 | Discharge: 2020-02-26 | Disposition: A | Discharge status: Home / Self Care | Payer: Federal, State, Local not specified - PPO | Attending: Internal Medicine | Admitting: Internal Medicine

## 2020-02-26 DIAGNOSIS — Z87442 Personal history of urinary calculi: Secondary | ICD-10-CM

## 2020-02-26 DIAGNOSIS — R109 Unspecified abdominal pain: Secondary | ICD-10-CM | POA: Diagnosis not present

## 2020-02-26 LAB — POCT URINALYSIS DIPSTICK, ED / UC
Bilirubin Urine: NEGATIVE
Glucose, UA: NEGATIVE mg/dL
Hgb urine dipstick: NEGATIVE
Ketones, ur: NEGATIVE mg/dL
Leukocytes,Ua: NEGATIVE
Nitrite: NEGATIVE
Protein, ur: NEGATIVE mg/dL
Specific Gravity, Urine: 1.02 (ref 1.005–1.030)
Urobilinogen, UA: 0.2 mg/dL (ref 0.0–1.0)
pH: 6 (ref 5.0–8.0)

## 2020-02-26 LAB — POC URINE PREG, ED: Preg Test, Ur: NEGATIVE

## 2020-02-26 MED ORDER — NAPROXEN 500 MG PO TABS: 500.0000 mg | ORAL_TABLET | Freq: Two times a day (BID) | ORAL | 0 refills | Status: DC

## 2020-02-26 MED ORDER — TAMSULOSIN HCL 0.4 MG PO CAPS: 0.4000 mg | ORAL_CAPSULE | Freq: Every day | ORAL | 0 refills | Status: DC

## 2020-02-26 MED ORDER — TIZANIDINE HCL 4 MG PO TABS: 4.0000 mg | ORAL_TABLET | Freq: Three times a day (TID) | ORAL | 0 refills | Status: DC | PRN

## 2020-02-26 NOTE — ED Provider Notes (Signed)
[name]   MRN: [id number] DOB: 23-Jul-1985  Subjective:   Meghan [name] is a 35 y.o. female presenting for 5 day history of left flank pain. Symptoms start when she urinates, pain radiates upwards to her flank side. Has a history of kidney stones and feels like it starting to happen again. Patient tries to hydrate well. Denies fever, n/v, hematuria.   No current facility-administered medications for this encounter.  Current Outpatient Medications:  Marland Kitchen  Multiple Vitamin (MULTIVITAMIN) tablet, Take 1 tablet by mouth daily., Disp: , Rfl:  .  vitamin B-12 (CYANOCOBALAMIN) 500 MCG tablet, Take 500 mcg by mouth daily., Disp: , Rfl:    No Known Allergies  Past Medical History:  Diagnosis Date  . Kidney stones      History reviewed. No pertinent surgical history.  Family History  Problem Relation Age of Onset  . Hyperlipidemia Father   . Diabetes Father   . Heart attack Father     Social History   Tobacco Use  . Smoking status: Never Smoker  . Smokeless tobacco: Never Used  Vaping Use  . Vaping Use: Never used  Substance Use Topics  . Alcohol use: Yes    Alcohol/week: 4.0 standard drinks    Types: 4 Glasses of wine per week  . Drug use: No    ROS   Objective:   Vitals: BP 123/74 (BP Location: Right Arm)   Pulse 82   Temp 97.7 F (36.5 C) (Temporal)   Resp 16   LMP 02/11/2020   SpO2 100%   Physical Exam Constitutional:      General: She is not in acute distress.    Appearance: Normal appearance. She is well-developed. She is not ill-appearing.  HENT:     Head: Normocephalic and atraumatic.     Nose: Nose normal.     Mouth/Throat:     Mouth: Mucous membranes are moist.     Pharynx: Oropharynx is clear.  Eyes:     General: No scleral icterus.    Extraocular Movements: Extraocular movements intact.     Pupils: Pupils are equal, round, and reactive to light.  Cardiovascular:     Rate and Rhythm: Normal rate.  Pulmonary:      Effort: Pulmonary effort is normal.  Abdominal:     Tenderness: There is no abdominal tenderness. There is no right CVA tenderness, left CVA tenderness, guarding or rebound.  Skin:    General: Skin is warm and dry.  Neurological:     General: No focal deficit present.     Mental Status: She is alert and oriented to person, place, and time.  Psychiatric:        Mood and Affect: Mood normal.        Behavior: Behavior normal.     Results for orders placed or performed during the hospital encounter of 02/26/20 (from the past 24 hour(s))  POC Urinalysis dipstick     Status: None   Collection Time: 02/26/20  5:19 PM  Result Value Ref Range   Glucose, UA NEGATIVE NEGATIVE mg/dL   Bilirubin Urine NEGATIVE NEGATIVE   Ketones, ur NEGATIVE NEGATIVE mg/dL   Specific Gravity, Urine 1.020 1.005 - 1.030   Hgb urine dipstick NEGATIVE NEGATIVE   pH 6.0 5.0 - 8.0   Protein, ur NEGATIVE NEGATIVE mg/dL   Urobilinogen, UA 0.2 0.0 - 1.0 mg/dL   Nitrite NEGATIVE NEGATIVE   Leukocytes,Ua NEGATIVE NEGATIVE  POC urine pregnancy     Status:  None   Collection Time: 02/26/20  5:25 PM  Result Value Ref Range   Preg Test, Ur NEGATIVE NEGATIVE    Assessment and Plan :   PDMP not reviewed this encounter.  1. Left flank pain   2. History of renal stone     Start hydrating very well. Use tizanidine, naproxen for musculoskeletal pain. Use Flomax to get help with possible developing kidney stone. Patient declined STI testing.  Counseled patient on potential for adverse effects with medications prescribed/recommended today, ER and return-to-clinic precautions discussed, patient verbalized understanding.    Wallis Bamberg, PA-C 02/26/20 1743

## 2020-02-26 NOTE — ED Triage Notes (Signed)
Patient reports left flank pain since Tuesday. Hx of kidney stones. States after urinating she will have sharp pain go up into left side. No fevers. Denies hematuria.

## 2020-02-28 DIAGNOSIS — N200 Calculus of kidney: Secondary | ICD-10-CM | POA: Insufficient documentation

## 2020-12-10 DIAGNOSIS — D649 Anemia, unspecified: Secondary | ICD-10-CM | POA: Insufficient documentation

## 2021-02-07 ENCOUNTER — Inpatient Hospital Stay (HOSPITAL_COMMUNITY)
Admission: AD | Admit: 2021-02-07 | Discharge: 2021-02-08 | Disposition: A | Discharge status: Home / Self Care | Payer: Federal, State, Local not specified - PPO | Attending: Obstetrics and Gynecology | Admitting: Obstetrics and Gynecology

## 2021-02-07 ENCOUNTER — Other Ambulatory Visit: Payer: Self-pay

## 2021-02-07 ENCOUNTER — Encounter (HOSPITAL_COMMUNITY): Payer: Self-pay | Admitting: Obstetrics and Gynecology

## 2021-02-07 ENCOUNTER — Inpatient Hospital Stay (HOSPITAL_COMMUNITY): Payer: Federal, State, Local not specified - PPO

## 2021-02-07 DIAGNOSIS — O23591 Infection of other part of genital tract in pregnancy, first trimester: Secondary | ICD-10-CM | POA: Insufficient documentation

## 2021-02-07 DIAGNOSIS — O09521 Supervision of elderly multigravida, first trimester: Secondary | ICD-10-CM | POA: Diagnosis not present

## 2021-02-07 DIAGNOSIS — O26899 Other specified pregnancy related conditions, unspecified trimester: Secondary | ICD-10-CM

## 2021-02-07 DIAGNOSIS — O26891 Other specified pregnancy related conditions, first trimester: Secondary | ICD-10-CM | POA: Diagnosis not present

## 2021-02-07 DIAGNOSIS — B9689 Other specified bacterial agents as the cause of diseases classified elsewhere: Secondary | ICD-10-CM | POA: Diagnosis not present

## 2021-02-07 DIAGNOSIS — Z3A01 Less than 8 weeks gestation of pregnancy: Secondary | ICD-10-CM | POA: Diagnosis not present

## 2021-02-07 DIAGNOSIS — O219 Vomiting of pregnancy, unspecified: Secondary | ICD-10-CM | POA: Diagnosis not present

## 2021-02-07 DIAGNOSIS — N760 Acute vaginitis: Secondary | ICD-10-CM | POA: Diagnosis not present

## 2021-02-07 DIAGNOSIS — R109 Unspecified abdominal pain: Secondary | ICD-10-CM | POA: Insufficient documentation

## 2021-02-07 DIAGNOSIS — Z3491 Encounter for supervision of normal pregnancy, unspecified, first trimester: Secondary | ICD-10-CM

## 2021-02-07 LAB — COMPREHENSIVE METABOLIC PANEL
ALT: 15 U/L (ref 0–44)
AST: 17 U/L (ref 15–41)
Albumin: 4.2 g/dL (ref 3.5–5.0)
Alkaline Phosphatase: 43 U/L (ref 38–126)
Anion gap: 8 (ref 5–15)
BUN: 6 mg/dL (ref 6–20)
CO2: 28 mmol/L (ref 22–32)
Calcium: 9.7 mg/dL (ref 8.9–10.3)
Chloride: 99 mmol/L (ref 98–111)
Creatinine, Ser: 0.68 mg/dL (ref 0.44–1.00)
GFR, Estimated: >60 mL/min (ref >60)
Glucose, Bld: 135 mg/dL — ABNORMAL HIGH (ref 70–99)
Potassium: 3.7 mmol/L (ref 3.5–5.1)
Sodium: 135 mmol/L (ref 135–145)
Total Bilirubin: 0.7 mg/dL (ref 0.3–1.2)
Total Protein: 7.4 g/dL (ref 6.5–8.1)

## 2021-02-07 LAB — WET PREP, GENITAL
Sperm: NONE SEEN
Trich, Wet Prep: NONE SEEN
WBC, Wet Prep HPF POC: <10 (ref <10)
Yeast Wet Prep HPF POC: NONE SEEN

## 2021-02-07 LAB — URINALYSIS, ROUTINE W REFLEX MICROSCOPIC
Bilirubin Urine: NEGATIVE
Glucose, UA: NEGATIVE mg/dL
Hgb urine dipstick: NEGATIVE
Ketones, ur: NEGATIVE mg/dL
Leukocytes,Ua: NEGATIVE
Nitrite: NEGATIVE
Protein, ur: NEGATIVE mg/dL
Specific Gravity, Urine: 1.015 (ref 1.005–1.030)
pH: 6 (ref 5.0–8.0)

## 2021-02-07 LAB — CBC
HCT: 37.2 % (ref 36.0–46.0)
Hemoglobin: 13 g/dL (ref 12.0–15.0)
MCH: 33.2 pg (ref 26.0–34.0)
MCHC: 34.9 g/dL (ref 30.0–36.0)
MCV: 95.1 fL (ref 80.0–100.0)
Platelets: 319 10*3/uL (ref 150–400)
RBC: 3.91 MIL/uL (ref 3.87–5.11)
RDW: 11.9 % (ref 11.5–15.5)
WBC: 10.6 10*3/uL — ABNORMAL HIGH (ref 4.0–10.5)
nRBC: 0 % (ref 0.0–0.2)

## 2021-02-07 LAB — ABO/RH: ABO/RH(D): O POS

## 2021-02-07 LAB — POCT PREGNANCY, URINE: Preg Test, Ur: POSITIVE — AB

## 2021-02-07 LAB — HCG, QUANTITATIVE, PREGNANCY: hCG, Beta Chain, Quant, S: 74579 m[IU]/mL — ABNORMAL HIGH (ref <5)

## 2021-02-07 MED ORDER — METRONIDAZOLE 0.75 % VA GEL: 1.0000 | Freq: Every day | VAGINAL | 1 refills | Status: DC

## 2021-02-07 MED ORDER — METOCLOPRAMIDE HCL 10 MG PO TABS: 10.0000 mg | ORAL_TABLET | Freq: Four times a day (QID) | ORAL | 0 refills | Status: DC

## 2021-02-07 NOTE — MAU Provider Note (Signed)
History     CSN: [id number]  Arrival date and time: 02/07/21 2047   Event Date/Time   First Provider Initiated Contact with Patient 02/07/21 2245      Chief Complaint  Patient presents with   Emesis   Abdominal Pain   HPI Meghan [name] is a 35 y.o. G5P2002 at [redacted]w[redacted]d by LMP who presents to MAU for nausea, vomiting, and lower abdominal cramping. Patient reports nausea and vomiting started on 12/10. Has had 4 episodes of vomiting in the past 24 hours. She also reports feeling very gassy and burping a lot. She reports some lower abdominal cramping that started over the weekend. She denies urinary s/s, vaginal bleeding, itching/odor, but reports a thick white discharge. LMP 12/29/2020.  OB History     Gravida  5   Para  2   Term      Preterm      AB  2   Living  2      SAB  2   IAB      Ectopic      Multiple      Live Births              Past Medical History:  Diagnosis Date   Kidney stones     No past surgical history on file.  Family History  Problem Relation Age of Onset   Hyperlipidemia Father    Diabetes Father    Heart attack Father     Social History   Tobacco Use   Smoking status: Never   Smokeless tobacco: Never  Vaping Use   Vaping Use: Never used  Substance Use Topics   Alcohol use: Yes    Alcohol/week: 4.0 standard drinks    Types: 4 Glasses of wine per week   Drug use: No    Allergies: No Known Allergies  Medications Prior to Admission  Medication Sig Dispense Refill Last Dose   Multiple Vitamin (MULTIVITAMIN) tablet Take 1 tablet by mouth daily.      naproxen (NAPROSYN) 500 MG tablet Take 1 tablet (500 mg total) by mouth 2 (two) times daily with a meal. 30 tablet 0    tamsulosin (FLOMAX) 0.4 MG CAPS capsule Take 1 capsule (0.4 mg total) by mouth daily after supper. 30 capsule 0    tiZANidine (ZANAFLEX) 4 MG tablet Take 1 tablet (4 mg total) by mouth every 8 (eight) hours as needed. 30 tablet 0    vitamin B-12  (CYANOCOBALAMIN) 500 MCG tablet Take 500 mcg by mouth daily.       Review of Systems  Constitutional: Negative.   Respiratory: Negative.    Gastrointestinal:  Positive for abdominal pain, nausea and vomiting.  Genitourinary:  Positive for vaginal discharge. Negative for dysuria, frequency, urgency and vaginal bleeding.  Musculoskeletal: Negative.   Neurological: Negative.    Physical Exam   Blood pressure 119/75, pulse 82, temperature 98.2 F (36.8 C), temperature source Oral, resp. rate 16, height 5\' 6"  (1.676 m), weight 68 kg, last menstrual period 12/29/2020, SpO2 98 %.  Physical Exam Vitals and nursing note reviewed.  Constitutional:      General: She is not in acute distress.    Appearance: She is not ill-appearing.  Cardiovascular:     Rate and Rhythm: Normal rate.  Pulmonary:     Effort: Pulmonary effort is normal.  Abdominal:     General: Abdomen is flat.     Palpations: Abdomen is soft.     Tenderness: There is no abdominal  tenderness.  Genitourinary:    Comments: Blind swabs obtained Musculoskeletal:        General: Normal range of motion.  Skin:    General: Skin is warm and dry.  Neurological:     General: No focal deficit present.     Mental Status: She is alert and oriented to person, place, and time.  Psychiatric:        Mood and Affect: Mood normal.        Behavior: Behavior normal.        Thought Content: Thought content normal.        Judgment: Judgment normal.    MAU Course  Procedures UA CBC, CMP, HCG, ABO/RH [place]  MDM UA and labs unremarkable Wet prep +clue cells, GC/CT pending [place] with IUP. FHR present.  Assessment and Plan  [redacted] weeks gestation of pregnancy IUP Abdominal pain affecting pregnancy Bacterial vaginosis Morning sickness  - Discharge home in stable condition - Rx for Metrogel and Reglan sent to pharmacy - Strict return precautions reviewed. Return to MAU as needed - Keep OB appointment as scheduled    Brand Males,  CNM 02/07/2021, 11:39 PM

## 2021-02-07 NOTE — MAU Note (Signed)
..  Meghan [name] is a 35 y.o. at Unknown here in MAU reporting: Had a positive pregnancy test at home. Reports nausea and vomiting since 01/26/2021. Reports 4 times in the past 24 hours. Lower abdominal cramping since the weekend. Denies vaginal bleeding, reports thick white vaginal discharge.  LMP: 12/29/2020 Pain score: 5/10 Vitals:   02/07/21 2135  BP: 119/75  Pulse: 82  Resp: 16  Temp: 98.2 F (36.8 C)  SpO2: 98%

## 2021-02-12 LAB — GC/CHLAMYDIA PROBE AMP (~~LOC~~) NOT AT ARMC
Chlamydia: NEGATIVE
Comment: NEGATIVE
Comment: NORMAL
Neisseria Gonorrhea: NEGATIVE

## 2021-02-17 NOTE — L&D Delivery Note (Signed)
Delivery Note At 5:16 PM a viable female was delivered via  (Presentation:  OA     ).  APGAR: pending ; weight pending .   Placenta status:  routine  .  Cord:  3VC with the following complications:  None  Cord pH: not sent. Easy delivery over intact perineum.   Anesthesia: Epidural Episiotomy:  None Lacerations:  None Suture Repair:  None Est. Blood Loss (mL): 202cc     It's a boy - "Warden/ranger"!!   Mom to postpartum.  Baby to Couplet care / Skin to Skin.  Ranae Pila 09/30/2021, 5:28 PM

## 2021-02-26 LAB — OB RESULTS CONSOLE RPR: RPR: NONREACTIVE

## 2021-02-26 LAB — HEPATITIS C ANTIBODY: HCV Ab: NEGATIVE

## 2021-02-26 LAB — OB RESULTS CONSOLE ABO/RH: RH Type: POSITIVE

## 2021-02-26 LAB — OB RESULTS CONSOLE HIV ANTIBODY (ROUTINE TESTING): HIV: NONREACTIVE

## 2021-02-26 LAB — OB RESULTS CONSOLE ANTIBODY SCREEN: Antibody Screen: NEGATIVE

## 2021-02-26 LAB — OB RESULTS CONSOLE RUBELLA ANTIBODY, IGM: Rubella: IMMUNE

## 2021-02-26 LAB — OB RESULTS CONSOLE HEPATITIS B SURFACE ANTIGEN: Hepatitis B Surface Ag: NEGATIVE

## 2021-03-11 LAB — OB RESULTS CONSOLE GC/CHLAMYDIA
Chlamydia: NEGATIVE
Neisseria Gonorrhea: NEGATIVE

## 2021-03-17 ENCOUNTER — Inpatient Hospital Stay (HOSPITAL_COMMUNITY): Payer: Federal, State, Local not specified - PPO

## 2021-03-17 ENCOUNTER — Inpatient Hospital Stay (HOSPITAL_COMMUNITY)
Admission: AD | Admit: 2021-03-17 | Discharge: 2021-03-17 | Disposition: A | Discharge status: Home / Self Care | Payer: Federal, State, Local not specified - PPO | Attending: Obstetrics and Gynecology | Admitting: Obstetrics and Gynecology

## 2021-03-17 ENCOUNTER — Other Ambulatory Visit: Payer: Self-pay

## 2021-03-17 ENCOUNTER — Encounter (HOSPITAL_COMMUNITY): Payer: Self-pay | Admitting: Obstetrics and Gynecology

## 2021-03-17 DIAGNOSIS — K5900 Constipation, unspecified: Secondary | ICD-10-CM | POA: Diagnosis not present

## 2021-03-17 DIAGNOSIS — B9689 Other specified bacterial agents as the cause of diseases classified elsewhere: Secondary | ICD-10-CM | POA: Insufficient documentation

## 2021-03-17 DIAGNOSIS — N200 Calculus of kidney: Secondary | ICD-10-CM | POA: Insufficient documentation

## 2021-03-17 DIAGNOSIS — O09521 Supervision of elderly multigravida, first trimester: Secondary | ICD-10-CM | POA: Diagnosis not present

## 2021-03-17 DIAGNOSIS — O26831 Pregnancy related renal disease, first trimester: Secondary | ICD-10-CM | POA: Diagnosis not present

## 2021-03-17 DIAGNOSIS — O26891 Other specified pregnancy related conditions, first trimester: Secondary | ICD-10-CM | POA: Insufficient documentation

## 2021-03-17 DIAGNOSIS — O99611 Diseases of the digestive system complicating pregnancy, first trimester: Secondary | ICD-10-CM | POA: Diagnosis not present

## 2021-03-17 DIAGNOSIS — Z87442 Personal history of urinary calculi: Secondary | ICD-10-CM | POA: Diagnosis not present

## 2021-03-17 DIAGNOSIS — Z3A11 11 weeks gestation of pregnancy: Secondary | ICD-10-CM | POA: Insufficient documentation

## 2021-03-17 DIAGNOSIS — O23591 Infection of other part of genital tract in pregnancy, first trimester: Secondary | ICD-10-CM | POA: Insufficient documentation

## 2021-03-17 DIAGNOSIS — R1032 Left lower quadrant pain: Secondary | ICD-10-CM | POA: Insufficient documentation

## 2021-03-17 LAB — CBC WITH DIFFERENTIAL/PLATELET
Abs Immature Granulocytes: 0.05 10*3/uL (ref 0.00–0.07)
Basophils Absolute: 0.1 10*3/uL (ref 0.0–0.1)
Basophils Relative: 1 %
Eosinophils Absolute: 0.1 10*3/uL (ref 0.0–0.5)
Eosinophils Relative: 1 %
HCT: 30.1 % — ABNORMAL LOW (ref 36.0–46.0)
Hemoglobin: 10.7 g/dL — ABNORMAL LOW (ref 12.0–15.0)
Immature Granulocytes: 1 %
Lymphocytes Relative: 26 %
Lymphs Abs: 2.6 10*3/uL (ref 0.7–4.0)
MCH: 33.5 pg (ref 26.0–34.0)
MCHC: 35.5 g/dL (ref 30.0–36.0)
MCV: 94.4 fL (ref 80.0–100.0)
Monocytes Absolute: 0.9 10*3/uL (ref 0.1–1.0)
Monocytes Relative: 9 %
Neutro Abs: 6.2 10*3/uL (ref 1.7–7.7)
Neutrophils Relative %: 62 %
Platelets: 316 10*3/uL (ref 150–400)
RBC: 3.19 MIL/uL — ABNORMAL LOW (ref 3.87–5.11)
RDW: 12.3 % (ref 11.5–15.5)
WBC: 9.9 10*3/uL (ref 4.0–10.5)
nRBC: 0 % (ref 0.0–0.2)

## 2021-03-17 LAB — URINALYSIS, ROUTINE W REFLEX MICROSCOPIC
Bilirubin Urine: NEGATIVE
Glucose, UA: NEGATIVE mg/dL
Hgb urine dipstick: NEGATIVE
Ketones, ur: NEGATIVE mg/dL
Leukocytes,Ua: NEGATIVE
Nitrite: NEGATIVE
Protein, ur: NEGATIVE mg/dL
Specific Gravity, Urine: >1.03 — ABNORMAL HIGH (ref 1.005–1.030)
pH: 6 (ref 5.0–8.0)

## 2021-03-17 MED ORDER — TRAMADOL HCL 50 MG PO TABS
50.0000 mg | ORAL_TABLET | Freq: Four times a day (QID) | ORAL | 0 refills | Status: DC | PRN
Start: 2021-03-17 — End: 2021-05-03

## 2021-03-17 MED ORDER — TAMSULOSIN HCL 0.4 MG PO CAPS: 0.4000 mg | ORAL_CAPSULE | Freq: Every day | ORAL | 0 refills | Status: DC

## 2021-03-17 NOTE — MAU Note (Signed)
Meghan [name] is a 36 y.o. at [redacted]w[redacted]d here in MAU reporting: BV treated with RX and continues to have thick discharge. Also reports pain lower abdomen and left side that radiates up side-sharp- this occurs after urination. Also reports social issues at home/stress also contributing. Pt denies bleeding or bloody show.  Onset of complaint: a week and a half-consistent the last 2 days Pain score: 7 Vitals:   03/17/21 2009  BP: 131/75  Pulse: 82  Resp: 17  Temp: 98.6 F (37 C)  SpO2: 100%

## 2021-03-17 NOTE — MAU Note (Signed)
Pt reports headache for "a few weeks that I cant shake"-pt reports sleep and tylenol are ineffective.

## 2021-03-17 NOTE — MAU Provider Note (Addendum)
Chief Complaint: Abdominal Cramping (History of kidney stones - pt feels her symptoms could possibly kidney stone)   Event Date/Time   First Provider Initiated Contact with Patient 03/17/21 2125     SUBJECTIVE HPI: Meghan [name] is a 36 y.o. [id number] at 263w1d by LMP who presents to maternity admissions reporting lower left abdominal pain which radiates up her flank.  This started a week and a half ago. . She denies vaginal bleeding, vaginal itching/burning, urinary symptoms, h/a, dizziness, n/v, or fever/chills.   ALso has some constipation  Abdominal Cramping This is a new problem. The current episode started 1 to 4 weeks ago. The problem occurs intermittently. The problem has been unchanged. The pain is located in the LLQ. The quality of the pain is cramping. The abdominal pain radiates to the left flank. Pertinent negatives include no constipation, diarrhea, dysuria, fever or frequency. Nothing aggravates the pain. The pain is relieved by Nothing. She has tried nothing for the symptoms.   RN Note: Meghan [name] is a 36 y.o. at 673w1d here in MAU reporting: BV treated with RX and continues to have thick discharge. Also reports pain lower abdomen and left side that radiates up side-sharp- this occurs after urination. Also reports social issues at home/stress also contributing. Pt denies bleeding or bloody show.  Onset of complaint: a week and a half-consistent the last 2 days Pain score: 7  Past Medical History:  Diagnosis Date   Kidney stones    History reviewed. No pertinent surgical history. Social History   Socioeconomic History   Marital status: Single    Spouse name: Not on file   Number of children: Not on file   Years of education: Not on file   Highest education level: Not on file  Occupational History   Not on file  Tobacco Use   Smoking status: Former    Types: Cigarettes   Smokeless tobacco: Never  Vaping Use   Vaping Use: Never used  Substance and Sexual  Activity   Alcohol use: Yes    Alcohol/week: 4.0 standard drinks    Types: 4 Glasses of wine per week   Drug use: No   Sexual activity: Yes    Partners: Male    Birth control/protection: Abstinence    Comment: 1st intercourse- 12, partners- 25+, current partner- 8 months  Other Topics Concern   Not on file  Social History Narrative   Not on file   Social Determinants of Health   Financial Resource Strain: Not on file  Food Insecurity: Not on file  Transportation Needs: Not on file  Physical Activity: Not on file  Stress: Not on file  Social Connections: Not on file  Intimate Partner Violence: Not on file   No current facility-administered medications on file prior to encounter.   Current Outpatient Medications on File Prior to Encounter  Medication Sig Dispense Refill   Prenatal Vit-Fe Fumarate-FA (PRENATAL VITAMINS PO) Take 1 tablet by mouth daily.     pyridOXINE (VITAMIN B-6) 50 MG tablet Take 50 mg by mouth 2 times daily at 12 noon and 4 pm.     metoCLOPramide (REGLAN) 10 MG tablet Take 1 tablet (10 mg total) by mouth every 6 (six) hours. (Patient not taking: Reported on 03/17/2021) 30 tablet 0   metroNIDAZOLE (METROGEL) 0.75 % vaginal gel Place 1 Applicatorful vaginally at bedtime. Apply one applicatorful to vagina at bedtime for 5 days (Patient not taking: Reported on 03/17/2021) 70 g 1   Multiple Vitamin (  MULTIVITAMIN) tablet Take 1 tablet by mouth daily.     vitamin B-12 (CYANOCOBALAMIN) 500 MCG tablet Take 500 mcg by mouth daily.     No Known Allergies  I have reviewed patient's Past Medical Hx, Surgical Hx, Family Hx, Social Hx, medications and allergies.   ROS:  Review of Systems  Constitutional:  Negative for fever.  Gastrointestinal:  Negative for constipation and diarrhea.  Genitourinary:  Negative for dysuria and frequency.  Review of Systems  Other systems negative   Physical Exam  Physical Exam Patient Vitals for the past 24 hrs:  BP Temp Temp src  Pulse Resp SpO2 Height Weight  03/17/21 2009 131/75 98.6 F (37 C) Oral 82 17 100 % 5\' 6"  (1.676 m) 70.3 kg   Constitutional: Well-developed, well-nourished female in no acute distress.  Cardiovascular: normal rate Respiratory: normal effort GI: Abd soft, non-tender. Pos BS x 4 MS: Extremities nontender, no edema, normal ROM Neurologic: Alert and oriented x 4.  GU: Neg CVAT.  PELVIC EXAM: closed and long  FHT 157 by doppler  LAB RESULTS Results for orders placed or performed during the hospital encounter of 03/17/21 (from the past 24 hour(s))  CBC with Differential/Platelet     Status: Abnormal   Collection Time: 03/17/21  9:40 PM  Result Value Ref Range   WBC 9.9 4.0 - 10.5 K/uL   RBC 3.19 (L) 3.87 - 5.11 MIL/uL   Hemoglobin 10.7 (L) 12.0 - 15.0 g/dL   HCT 03/19/21 (L) 03.5 - 00.9 %   MCV 94.4 80.0 - 100.0 fL   MCH 33.5 26.0 - 34.0 pg   MCHC 35.5 30.0 - 36.0 g/dL   RDW 38.1 82.9 - 93.7 %   Platelets 316 150 - 400 K/uL   nRBC 0.0 0.0 - 0.2 %   Neutrophils Relative % 62 %   Neutro Abs 6.2 1.7 - 7.7 K/uL   Lymphocytes Relative 26 %   Lymphs Abs 2.6 0.7 - 4.0 K/uL   Monocytes Relative 9 %   Monocytes Absolute 0.9 0.1 - 1.0 K/uL   Eosinophils Relative 1 %   Eosinophils Absolute 0.1 0.0 - 0.5 K/uL   Basophils Relative 1 %   Basophils Absolute 0.1 0.0 - 0.1 K/uL   Immature Granulocytes 1 %   Abs Immature Granulocytes 0.05 0.00 - 0.07 K/uL  Urinalysis, Routine w reflex microscopic     Status: Abnormal   Collection Time: 03/17/21  9:49 PM  Result Value Ref Range   Color, Urine YELLOW YELLOW   APPearance CLEAR CLEAR   Specific Gravity, Urine >1.030 (H) 1.005 - 1.030   pH 6.0 5.0 - 8.0   Glucose, UA NEGATIVE NEGATIVE mg/dL   Hgb urine dipstick NEGATIVE NEGATIVE   Bilirubin Urine NEGATIVE NEGATIVE   Ketones, ur NEGATIVE NEGATIVE mg/dL   Protein, ur NEGATIVE NEGATIVE mg/dL   Nitrite NEGATIVE NEGATIVE   Leukocytes,Ua NEGATIVE NEGATIVE   RBC / HPF 0-5 0 - 5 RBC/hpf   WBC, UA  0-5 0 - 5 WBC/hpf   Bacteria, UA RARE (A) NONE SEEN   Squamous Epithelial / LPF 0-5 0 - 5   Mucus PRESENT    --/--/O POS (12/22 2141)  IMAGING 2142 Renal  Result Date: 03/17/2021 CLINICAL DATA:  Left flank pain, history of renal stones EXAM: RENAL / URINARY TRACT ULTRASOUND COMPLETE COMPARISON:  CT abdomen/pelvis dated 02/29/2020 FINDINGS: Right Kidney: Renal measurements: 11.3 x 4.4 x 5.5 cm = volume: 144.7 mL. Echogenicity within normal limits. 4 mm upper pole  calculus. 4 mm interpolar calculus. No hydronephrosis. Left Kidney: Renal measurements: 11.8 x 5.3 x 5.4 cm = volume: 177 mL. Echogenicity within normal limits. 3 mm upper pole calculus. No hydronephrosis. Bladder: Appears normal for degree of bladder distention. Other: None. IMPRESSION: Bilateral nonobstructing renal calculi measuring up to 3-4 mm. No hydronephrosis. Electronically Signed   By: Charline Bills M.D.   On: 03/17/2021 22:29     MAU Management/MDM: Ordered CBC and UA which were WNL.   Will check Ultrasound to rule out stones They did see stones in each kidney, nonobstructing.  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 Will followup with them   ASSESSMENT Single IUP at [redacted]w[redacted]d Lower abdominal pain Bilateral renal calculi, nonobstructing  PLAN Discharge home Rx Flowmax for stones Rx Tramadol for pain  Rx Miralax for constipation Followup with OB and Urologist for definitive plan of care  Pt stable at time of discharge. Encouraged to return here if she develops worsening of symptoms, increase in pain, fever, or other concerning symptoms.    Wynelle Bourgeois CNM, MSN Certified Nurse-Midwife 03/17/2021  9:26 PM

## 2021-04-14 ENCOUNTER — Other Ambulatory Visit: Payer: Self-pay | Admitting: Advanced Practice Midwife

## 2021-04-25 ENCOUNTER — Encounter (HOSPITAL_BASED_OUTPATIENT_CLINIC_OR_DEPARTMENT_OTHER): Payer: Self-pay | Admitting: Emergency Medicine

## 2021-04-25 ENCOUNTER — Emergency Department (HOSPITAL_BASED_OUTPATIENT_CLINIC_OR_DEPARTMENT_OTHER)
Admission: EM | Admit: 2021-04-25 | Discharge: 2021-04-25 | Disposition: A | Discharge status: Home / Self Care | Payer: Federal, State, Local not specified - PPO | Attending: Emergency Medicine | Admitting: Emergency Medicine

## 2021-04-25 ENCOUNTER — Other Ambulatory Visit: Payer: Self-pay

## 2021-04-25 DIAGNOSIS — R519 Headache, unspecified: Secondary | ICD-10-CM | POA: Insufficient documentation

## 2021-04-25 DIAGNOSIS — Z3A17 17 weeks gestation of pregnancy: Secondary | ICD-10-CM | POA: Diagnosis not present

## 2021-04-25 DIAGNOSIS — O26891 Other specified pregnancy related conditions, first trimester: Secondary | ICD-10-CM | POA: Insufficient documentation

## 2021-04-25 MED ORDER — METOCLOPRAMIDE HCL 5 MG/ML IJ SOLN
10.0000 mg | Freq: Once | INTRAMUSCULAR | Status: AC
  Administered 2021-04-25: 11:00:00 10 mg via INTRAVENOUS
  Filled 2021-04-25: qty 2

## 2021-04-25 MED ORDER — DIPHENHYDRAMINE HCL 50 MG/ML IJ SOLN
25.0000 mg | Freq: Once | INTRAMUSCULAR | Status: AC
  Administered 2021-04-25: 11:00:00 25 mg via INTRAVENOUS
  Filled 2021-04-25: qty 1

## 2021-04-25 MED ORDER — LIDOCAINE-EPINEPHRINE (PF) 2 %-1:200000 IJ SOLN
10.0000 mL | Freq: Once | INTRAMUSCULAR | Status: AC
  Administered 2021-04-25: 10 mL via INTRADERMAL
  Filled 2021-04-25: qty 20

## 2021-04-25 MED ORDER — SODIUM CHLORIDE 0.9 % IV BOLUS
1000.0000 mL | Freq: Once | INTRAVENOUS | Status: AC
Start: 2021-04-25 — End: 2021-04-25
  Administered 2021-04-25: 11:00:00 1000 mL via INTRAVENOUS

## 2021-04-25 NOTE — ED Provider Notes (Signed)
?[facility] ?Provider Note ? ? ?CSN: [id number] ?Arrival date & time: 04/25/21  1047 ? ?  ? ?[id number] ? ?Chief Complaint  ?Patient presents with  ? Headache  ? ? ?Meghan [name] is a 36 y.o. female. ? ?36 yo F with a chief complaints of a headache.  This is right-sided and sharp and shooting.  Last for couple seconds at a time and then resolves.  Has never had a headache like this before.  Denies head trauma denies neck pain.  She went for a run yesterday without any issue.  She denies cough congestion or fever.  Denies neck stiffness.  Denies one-sided numbness or weakness denies difficulty speech or swallowing.  She is [redacted] weeks pregnant.  Has not had no complications thus far with this pregnancy. ? ? ?Headache ? ?  ? ?Home Medications ?Prior to Admission medications   ?Medication Sig Start Date End Date Taking? Authorizing Provider  ?metoCLOPramide (REGLAN) 10 MG tablet Take 1 tablet (10 mg total) by mouth every 6 (six) hours. ?Patient not taking: Reported on 03/17/2021 02/07/21   Brand MalesSimpson, Kaye L, CNM  ?metroNIDAZOLE (METROGEL) 0.75 % vaginal gel Place 1 Applicatorful vaginally at bedtime. Apply one applicatorful to vagina at bedtime for 5 days ?Patient not taking: Reported on 03/17/2021 02/07/21   Brand MalesSimpson, Mykhia L, CNM  ?Multiple Vitamin (MULTIVITAMIN) tablet Take 1 tablet by mouth daily.    [provider]  ?Prenatal Vit-Fe Fumarate-FA (PRENATAL VITAMINS PO) Take 1 tablet by mouth daily.    [provider]  ?pyridOXINE (VITAMIN B-6) 50 MG tablet Take 50 mg by mouth 2 times daily at 12 noon and 4 pm.    [provider]  ?tamsulosin (FLOMAX) 0.4 MG CAPS capsule Take 1 capsule (0.4 mg total) by mouth daily. 03/17/21   Aviva SignsWilliams, Marie L, CNM  ?traMADol (ULTRAM) 50 MG tablet Take 1 tablet (50 mg total) by mouth every 6 (six) hours as needed. 03/17/21   Aviva SignsWilliams, Marie L, CNM  ?vitamin B-12 (CYANOCOBALAMIN) 500 MCG tablet Take 500 mcg by mouth daily.     [provider]  ?   ? ?Allergies    ?Other   ? ?Review of Systems   ?Review of Systems  ?Neurological:  Positive for headaches.  ? ?Physical Exam ?Updated Vital Signs ?BP 114/71   Pulse 77   Temp 98.3 ?F (36.8 ?C) (Oral)   Resp 16   Ht 5\' 6"  (1.676 m)   Wt 72.6 kg   LMP 12/29/2020   SpO2 100%   BMI 25.82 kg/m?  ?Physical Exam ?Vitals and nursing note reviewed.  ?Constitutional:   ?   General: She is not in acute distress. ?   Appearance: She is well-developed. She is not diaphoretic.  ?HENT:  ?   Head: Normocephalic and atraumatic.  ?   Comments: Not obviously tender on exam but seems to have pain that localizes to the attachment of the trapezius to the occiput ?Eyes:  ?   Pupils: Pupils are equal, round, and reactive to light.  ?Cardiovascular:  ?   Rate and Rhythm: Normal rate and regular rhythm.  ?   Heart sounds: No murmur heard. ?  No friction rub. No gallop.  ?Pulmonary:  ?   Effort: Pulmonary effort is normal.  ?   Breath sounds: No wheezing or rales.  ?Abdominal:  ?   General: There is no distension.  ?   Palpations: Abdomen is soft.  ?   Tenderness: There is no  abdominal tenderness.  ?Musculoskeletal:     ?   General: No tenderness.  ?   Cervical back: Normal range of motion and neck supple.  ?Skin: ?   General: Skin is warm and dry.  ?Neurological:  ?   Mental Status: She is alert and oriented to person, place, and time.  ?   Cranial Nerves: Cranial nerves 2-12 are intact.  ?   Sensory: Sensation is intact.  ?   Motor: Motor function is intact.  ?   Coordination: Coordination is intact.  ?   Gait: Gait is intact.  ?   Comments: Benign neurologic exam.  Ambulates without issue.  ?Psychiatric:     ?   Behavior: Behavior normal.  ? ? ?ED Results / Procedures / Treatments   ?Labs ?(all labs ordered are listed, but only abnormal results are displayed) ?Labs Reviewed - No data to display ? ?EKG ?None ? ?Radiology ?No results found. ? ?Procedures ?Procedures  ? ? ?Medications Ordered in  ED ?Medications  ?sodium chloride 0.9 % bolus 1,000 mL (1,000 mLs Intravenous New Bag/Given 04/25/21 1119)  ?metoCLOPramide (REGLAN) injection 10 mg (10 mg Intravenous Given 04/25/21 1120)  ?diphenhydrAMINE (BENADRYL) injection 25 mg (25 mg Intravenous Given 04/25/21 1120)  ?lidocaine-EPINEPHrine (XYLOCAINE W/EPI) 2 %-1:200000 (PF) injection 10 mL (10 mLs Intradermal Given 04/25/21 1125)  ? ? ?ED Course/ Medical Decision Making/ A&P ?  ?                        ?Medical Decision Making ?Risk ?Prescription drug management. ? ? ?36 yo F who is [redacted] weeks pregnant comes in with a chief complaints of a right occipital headache.  This seems to come and go lasting for seconds at a time.  Going on for about 3 days now and getting more intense and more frequent.  Patient points right to an area where I would expect the occipital nerve to exit the skull.  Plan for a localized injection headache cocktail reassess. ? ?I feel her symptoms are atypical of venous sinus thrombosis also its a bit early in pregnancy for common presentation.  Her blood pressure is normal and she is also early in pregnancy for eclampsia. ? ?When I went in to perform the local injection of lidocaine the patient actually was feeling quite a bit better after getting the headache cocktail.  We will hold off on local injection. ? ?Patient continues to be asymptomatic.  Will discharge home.  Have her follow-up with neurology in the office. ? ?12:19 PM:  I have discussed the diagnosis/risks/treatment options with the patient.  Evaluation and diagnostic testing in the emergency department does not suggest an emergent condition requiring admission or immediate intervention beyond what has been performed at this time.  They will follow up with OB, neuro. We also discussed returning to the ED immediately if new or worsening sx occur. We discussed the sx which are most concerning (e.g., sudden worsening pain, fever, inability to tolerate by mouth) that necessitate immediate  return. Medications administered to the patient during their visit and any new prescriptions provided to the patient are listed below. ? ?Medications given during this visit ?Medications  ?sodium chloride 0.9 % bolus 1,000 mL (1,000 mLs Intravenous New Bag/Given 04/25/21 1119)  ?metoCLOPramide (REGLAN) injection 10 mg (10 mg Intravenous Given 04/25/21 1120)  ?diphenhydrAMINE (BENADRYL) injection 25 mg (25 mg Intravenous Given 04/25/21 1120)  ?lidocaine-EPINEPHrine (XYLOCAINE W/EPI) 2 %-1:200000 (PF) injection 10 mL (10 mLs Intradermal  Given 04/25/21 1125)  ? ? ? ?The patient appears reasonably screen and/or stabilized for discharge and I doubt any other medical condition or other [facility] requiring further screening, evaluation, or treatment in the ED at this time prior to discharge.  ? ? ? ? ? ? ? ? ?Final Clinical Impression(s) / ED Diagnoses ?Final diagnoses:  ?Occipital headache  ? ? ?Rx / DC Orders ?ED Discharge Orders   ? ?      Ordered  ?  Ambulatory referral to Neurology       ?Comments: New headache onset in pregnancy, hormone induced migraines?  ? 04/25/21 1218  ? ?  ?  ? ?  ? ? ?  ?Melene Plan, DO ?04/25/21 1219 ? ?

## 2021-04-25 NOTE — Discharge Instructions (Signed)
Please follow-up with the neurologist in the office.  Also discussed your visit with the OB and see if they want to change any of your management.  At this point I would have you take just Tylenol as needed for pain at home. ?

## 2021-04-25 NOTE — ED Triage Notes (Signed)
Pt arrives to ED with c/o headache. Pt reports this started x3 days ago. Pt reports she is experiencing sharp/stabbing pains in the posterior area of her head. This in intermittent and last 10-15 seconds in duration. Pt denies vision abnormalities, dizziness, fevers, neck pain. Pt is [redacted] weeks pregnant.  ?

## 2021-05-02 NOTE — Progress Notes (Signed)
? ?Referring:  ?Melene Plan, DO ?[address] ?[place],  [place] [address] ? ?PCP: ?Pcp, No ? ?Neurology was asked to evaluate Meghan [name], a 36 year old female for a chief complaint of headaches.  Our recommendations of care will be communicated by shared medical record.   ? ?CC:  headaches ? ?HPI:  ?Medical co-morbidities: kidney stones ? ?The patient presents for evaluation of headaches which began 2 weeks ago. She is [redacted] weeks pregnant. Does not have a significant history of headaches before this. Headaches are described as stabbing pains in her occiput and vertex. They are not associated with photophobia, phonophobia, or nausea. Stabbing pains last 10-15 seconds at a time and can last on and off for up to 30 minutes. She has tried Tylenol previously which has been ineffective. ? ?Headache History: ?Onset: 2 weeks ago ?Triggers: none ?Aura: none ?Location: occiput, vertex ?Quality/Description: stabbing ?Associated Symptoms: ? Photophobia: no ? Phonophobia: no ? Nausea: no ?Worse with activity?: no ?Duration of headaches: 10-15 seconds, can last on and off up to 30 minutes ? ? ?Current Treatment: ?Abortive ?Tylenol ? ?Preventative ?none ? ?Prior Therapies                                 ?Tylenol ? ? ?LABS: ?CBC ?   ?Component Value Date/Time  ? WBC 9.9 03/17/2021 2140  ? RBC 3.19 (L) 03/17/2021 2140  ? HGB 10.7 (L) 03/17/2021 2140  ? HCT 30.1 (L) 03/17/2021 2140  ? PLT 316 03/17/2021 2140  ? MCV 94.4 03/17/2021 2140  ? MCH 33.5 03/17/2021 2140  ? MCHC 35.5 03/17/2021 2140  ? RDW 12.3 03/17/2021 2140  ? LYMPHSABS 2.6 03/17/2021 2140  ? MONOABS 0.9 03/17/2021 2140  ? EOSABS 0.1 03/17/2021 2140  ? BASOSABS 0.1 03/17/2021 2140  ? ?CMP Latest Ref Rng & Units 02/07/2021  ?Glucose 70 - 99 mg/dL 268(T)  ?BUN 6 - 20 mg/dL 6  ?Creatinine 0.44 - 1.00 mg/dL 4.19  ?Sodium 135 - 145 mmol/L 135  ?Potassium 3.5 - 5.1 mmol/L 3.7  ?Chloride 98 - 111 mmol/L 99  ?CO2 22 - 32 mmol/L 28  ?Calcium 8.9 - 10.3 mg/dL 9.7  ?Total Protein 6.5  - 8.1 g/dL 7.4  ?Total Bilirubin 0.3 - 1.2 mg/dL 0.7  ?Alkaline Phos 38 - 126 U/L 43  ?AST 15 - 41 U/L 17  ?ALT 0 - 44 U/L 15  ? ? ? ?IMAGING:  ?none ? ? ?Current Outpatient Medications on File Prior to Visit  ?Medication Sig Dispense Refill  ? Multiple Vitamin (MULTIVITAMIN) tablet Take 1 tablet by mouth daily.    ? Prenatal Vit-Fe Fumarate-FA (PRENATAL VITAMINS PO) Take 1 tablet by mouth daily.    ? pyridOXINE (VITAMIN B-6) 50 MG tablet Take 50 mg by mouth 2 times daily at 12 noon and 4 pm.    ? metoCLOPramide (REGLAN) 10 MG tablet Take 1 tablet (10 mg total) by mouth every 6 (six) hours. (Patient not taking: Reported on 03/17/2021) 30 tablet 0  ? metroNIDAZOLE (METROGEL) 0.75 % vaginal gel Place 1 Applicatorful vaginally at bedtime. Apply one applicatorful to vagina at bedtime for 5 days (Patient not taking: Reported on 03/17/2021) 70 g 1  ? tamsulosin (FLOMAX) 0.4 MG CAPS capsule Take 1 capsule (0.4 mg total) by mouth daily. (Patient not taking: Reported on 05/03/2021) 30 capsule 0  ? traMADol (ULTRAM) 50 MG tablet Take 1 tablet (50 mg total) by  mouth every 6 (six) hours as needed. (Patient not taking: Reported on 05/03/2021) 15 tablet 0  ? ?No current facility-administered medications on file prior to visit.  ? ? ? ?Allergies: ?No Known Allergies ? ? ?Family History: ?Migraine or other headaches in the family:  no ?Aneurysms in a first degree relative:  no ?Brain tumors in the family:  no ?Other neurological illness in the family:   no ? ?Past Medical History: ?Past Medical History:  ?Diagnosis Date  ? Kidney stones   ? ? ?Past Surgical History ?History reviewed. No pertinent surgical history. ? ?Social History: ?Social History  ? ?Tobacco Use  ? Smoking status: Former  ?  Types: Cigarettes  ? Smokeless tobacco: Never  ?Vaping Use  ? Vaping Use: Never used  ?Substance Use Topics  ? Alcohol use: Not Currently  ?  Alcohol/week: 4.0 standard drinks  ?  Types: 4 Glasses of wine per week  ? Drug use: No   ? ? ?ROS: ?Negative for fevers, chills. Positive for headaches. All other systems reviewed and negative unless stated otherwise in HPI. ? ? ?Physical Exam:  ? ?Vital Signs: ?BP 113/66 (BP Location: Right Arm, Patient Position: Sitting)   Pulse 74   Ht 5\' 6"  (1.676 m)   Wt 160 lb (72.6 kg)   LMP 12/29/2020   BMI 25.82 kg/m?  ?GENERAL: well appearing,in no acute distress,alert ?SKIN:  Color, texture, turgor normal. No rashes or lesions ?HEAD:  Normocephalic/atraumatic. ?CV:  RRR ?RESP: Normal respiratory effort ?MSK: no gross joint deformities ? ?NEUROLOGICAL: ?Mental Status: Alert, oriented to person, place and time,Follows commands ?Cranial Nerves: PERRL, visual fields intact to confrontation, extraocular movements intact, facial sensation intact, no facial droop or ptosis, hearing grossly intact, no dysarthria ?Motor: muscle strength 5/5 both upper and lower extremities ?Reflexes: 2+ throughout ?Sensation: intact to light touch all 4 extremities ?Coordination: Finger-to- nose-finger intact bilaterally ?Gait: normal-based ? ? ?IMPRESSION: ?36 year old female with a history of kidney stones who is [redacted] weeks pregnant presents for evaluation of headaches which began 2 weeks ago. Stabbing occipital headaches are consistent with occipital neuralgia. Discussed treatment options during pregnancy. Will plan on starting magnesium 400 mg daily for headache prevention. She will return to the office for an occipital nerve block.  ? ?PLAN: ?-Start magnesium 400 mg daily for headache prevention ?-Return to office for occipital nerve block ?-next steps: consider neck PT ? ?I spent a total of 26 minutes chart reviewing and counseling the patient. Headache education was done. Discussed treatment options including natural supplements, nerve blocks, and physical therapy. Discussed medication side effects, adverse reactions and drug interactions. Written educational materials and patient instructions outlining all of the above  were given. ? ?Follow-up: 4 months ? ? ?20, MD ?05/03/2021   ?11:53 AM ? ? ?

## 2021-05-03 ENCOUNTER — Encounter: Payer: Self-pay | Admitting: Psychiatry

## 2021-05-03 ENCOUNTER — Ambulatory Visit: Payer: Federal, State, Local not specified - PPO | Admitting: Psychiatry

## 2021-05-03 VITALS — BP 113/66 | HR 74 | Ht 66.0 in | Wt 160.0 lb

## 2021-05-03 DIAGNOSIS — M5481 Occipital neuralgia: Secondary | ICD-10-CM | POA: Diagnosis not present

## 2021-05-03 NOTE — Patient Instructions (Addendum)
CPT codes: 32951 or [phone] ? ?Start magnesium 400 mg at bedtime for headache prevention ? ?Schedule occipital nerve block with me ? ?Headache AND PREGNANCY ? ? ?ACUTE Treatment of Headache in Pregnancy ?- Usual abortive treatments such as NSAIDS are Category C in pregnancy.  ?- Acetaminophen (Tylenol) up to 500 mg is considered the safest analgesic to use in pregnancy (category B), and it can be effective if used early. Benadryl can also be used. ? ?PROPHYLACTIC Treatment ?- Magnesium oxide in doses up to 400mg  per day is often considered first line in pregnancy as a preventative (level B evidence of efficacy) and is thought to be safe.  ?- Cyproheptadine (brand name Periactin) is an older anti-histamine that can be used (max dose 8mg /day) in prophylaxis and has not been shown to have adverse effects to the fetus. Side effects include fatigue and weight gain. This should not be used while breastfeeding.  ? ?Non-pharmacological Methods of Treatment - these are the safest interventions ?- Nerve blocks (lidocaine only unless after 20 weeks) ?- Physical Therapy ?- Acupuncture ?- Behavioral Therapies ? - Relaxation strategies, stress management, biofeedback, yoga ? - Identification of triggers ?- Lifestyle changes: eating regular healthy meals, adequate sleep, staying well-hydrated with water (64 oz/day), and at least 30 min of exercise (walking is fine) per day ? ?

## 2021-06-17 ENCOUNTER — Institutional Professional Consult (permissible substitution): Payer: Federal, State, Local not specified - PPO | Admitting: Neurology

## 2021-06-20 ENCOUNTER — Ambulatory Visit: Payer: Federal, State, Local not specified - PPO | Admitting: Psychiatry

## 2021-08-06 ENCOUNTER — Institutional Professional Consult (permissible substitution): Payer: Federal, State, Local not specified - PPO | Admitting: Neurology

## 2021-08-08 ENCOUNTER — Encounter (HOSPITAL_COMMUNITY): Payer: Self-pay | Admitting: Obstetrics and Gynecology

## 2021-08-08 ENCOUNTER — Inpatient Hospital Stay (HOSPITAL_COMMUNITY)
Admission: AD | Admit: 2021-08-08 | Discharge: 2021-08-08 | Disposition: A | Discharge status: Home / Self Care | Payer: Federal, State, Local not specified - PPO | Attending: Obstetrics and Gynecology | Admitting: Obstetrics and Gynecology

## 2021-08-08 ENCOUNTER — Inpatient Hospital Stay (HOSPITAL_COMMUNITY): Payer: Federal, State, Local not specified - PPO

## 2021-08-08 ENCOUNTER — Other Ambulatory Visit: Payer: Self-pay

## 2021-08-08 DIAGNOSIS — O212 Late vomiting of pregnancy: Secondary | ICD-10-CM | POA: Insufficient documentation

## 2021-08-08 DIAGNOSIS — E876 Hypokalemia: Secondary | ICD-10-CM | POA: Diagnosis not present

## 2021-08-08 DIAGNOSIS — R109 Unspecified abdominal pain: Secondary | ICD-10-CM | POA: Diagnosis not present

## 2021-08-08 DIAGNOSIS — O99891 Other specified diseases and conditions complicating pregnancy: Secondary | ICD-10-CM | POA: Insufficient documentation

## 2021-08-08 DIAGNOSIS — O99283 Endocrine, nutritional and metabolic diseases complicating pregnancy, third trimester: Secondary | ICD-10-CM | POA: Insufficient documentation

## 2021-08-08 DIAGNOSIS — O1493 Unspecified pre-eclampsia, third trimester: Secondary | ICD-10-CM

## 2021-08-08 DIAGNOSIS — O26893 Other specified pregnancy related conditions, third trimester: Secondary | ICD-10-CM | POA: Insufficient documentation

## 2021-08-08 DIAGNOSIS — O113 Pre-existing hypertension with pre-eclampsia, third trimester: Secondary | ICD-10-CM | POA: Insufficient documentation

## 2021-08-08 DIAGNOSIS — O10913 Unspecified pre-existing hypertension complicating pregnancy, third trimester: Secondary | ICD-10-CM | POA: Insufficient documentation

## 2021-08-08 DIAGNOSIS — Z3A31 31 weeks gestation of pregnancy: Secondary | ICD-10-CM | POA: Diagnosis not present

## 2021-08-08 DIAGNOSIS — L2084 Intrinsic (allergic) eczema: Secondary | ICD-10-CM | POA: Insufficient documentation

## 2021-08-08 LAB — CBC
HCT: 29.4 % — ABNORMAL LOW (ref 36.0–46.0)
Hemoglobin: 10.3 g/dL — ABNORMAL LOW (ref 12.0–15.0)
MCH: 33.1 pg (ref 26.0–34.0)
MCHC: 35 g/dL (ref 30.0–36.0)
MCV: 94.5 fL (ref 80.0–100.0)
Platelets: 264 10*3/uL (ref 150–400)
RBC: 3.11 MIL/uL — ABNORMAL LOW (ref 3.87–5.11)
RDW: 13 % (ref 11.5–15.5)
WBC: 7.9 10*3/uL (ref 4.0–10.5)
nRBC: 0 % (ref 0.0–0.2)

## 2021-08-08 LAB — COMPREHENSIVE METABOLIC PANEL
ALT: 14 U/L (ref 0–44)
AST: 24 U/L (ref 15–41)
Albumin: 2.8 g/dL — ABNORMAL LOW (ref 3.5–5.0)
Alkaline Phosphatase: 56 U/L (ref 38–126)
Anion gap: 9 (ref 5–15)
BUN: <5 mg/dL — ABNORMAL LOW (ref 6–20)
CO2: 24 mmol/L (ref 22–32)
Calcium: 8.5 mg/dL — ABNORMAL LOW (ref 8.9–10.3)
Chloride: 103 mmol/L (ref 98–111)
Creatinine, Ser: 0.56 mg/dL (ref 0.44–1.00)
GFR, Estimated: >60 mL/min (ref >60)
Glucose, Bld: 150 mg/dL — ABNORMAL HIGH (ref 70–99)
Potassium: 2.4 mmol/L — CL (ref 3.5–5.1)
Sodium: 136 mmol/L (ref 135–145)
Total Bilirubin: 0.4 mg/dL (ref 0.3–1.2)
Total Protein: 6.1 g/dL — ABNORMAL LOW (ref 6.5–8.1)

## 2021-08-08 LAB — URINALYSIS, ROUTINE W REFLEX MICROSCOPIC
Bilirubin Urine: NEGATIVE
Glucose, UA: NEGATIVE mg/dL
Hgb urine dipstick: NEGATIVE
Ketones, ur: 80 mg/dL — AB
Leukocytes,Ua: NEGATIVE
Nitrite: NEGATIVE
Protein, ur: NEGATIVE mg/dL
Specific Gravity, Urine: 1.01 (ref 1.005–1.030)
pH: 7 (ref 5.0–8.0)

## 2021-08-08 LAB — PROTEIN / CREATININE RATIO, URINE
Creatinine, Urine: 53.01 mg/dL
Protein Creatinine Ratio: 0.38 mg/mg{Cre} — ABNORMAL HIGH (ref 0.00–0.15)
Total Protein, Urine: 20 mg/dL

## 2021-08-08 LAB — MAGNESIUM: Magnesium: 1.7 mg/dL (ref 1.7–2.4)

## 2021-08-08 MED ORDER — POTASSIUM CHLORIDE CRYS ER 20 MEQ PO TBCR
40.0000 meq | EXTENDED_RELEASE_TABLET | Freq: Once | ORAL | Status: AC
  Administered 2021-08-08: 40 meq via ORAL
  Filled 2021-08-08: qty 2

## 2021-08-08 MED ORDER — POTASSIUM CHLORIDE CRYS ER 20 MEQ PO TBCR: 20.0000 meq | EXTENDED_RELEASE_TABLET | Freq: Two times a day (BID) | ORAL | 0 refills | Status: DC

## 2021-08-08 NOTE — MAU Note (Signed)
Lab critical value received K 2.4 Reported to Erin Lawrence,NP at 2135 No orders at this time

## 2021-08-08 NOTE — MAU Note (Addendum)
Meghan [name] is a 36 y.o. at [redacted]w[redacted]d here in MAU reporting: went to doctor this mornings states BP was 162/86. Pt states "I think I'm passing a kidney stone" states the doctor recommended she come to MAU for further evaluation of her blood pressures. States she thought she could just monitor her blood pressure at home so she did not come straight here but got a phone call from her OB doctor around 1700 and was told to come in. Reports pain in left side during urination. Reports HA, states she took tylenol around 1600 but it has not relieved the HA. Denies visual disturbances or epigastric pain. Reports lower abdominal cramping that comes and goes. Denies VB or LOF. +FM   Pain score: 5 Vitals:   08/08/21 1952  BP: (!) 141/81  Pulse: 84  Resp: 18  Temp: 98.4 F (36.9 C)  SpO2: 98%    FHT:143 Lab orders placed from triage: u/a

## 2021-08-08 NOTE — MAU Provider Note (Cosign Needed)
History     [id number]  Arrival date and time: 08/08/21 1935    Chief Complaint  Patient presents with   Hypertension     HPI Meghan [name] is a 36 y.o. at [redacted]w[redacted]d who presents for hypertension. Was seen this morning in the office & had elevated BP; was instructed to come to MAU for evaluation. Patient attributed elevated BP to left flank pain she's been having. She has history of kidney stones & feels like she's had another one for the last 3 weeks. Denies history of chronic hypertension nor gestational hypertension. Reports increase in headaches with the pregnancy but currently denies headache. No visual disturbance  Reports left flank pain for the last 3 weeks. Feels similar to previous episodes of stones. Has been treating with tylenol. Vomited twice this week. Denies fever, nausea, dysuria, or hematuria.  Denies contractions, LOF, or vaginal bleeding. Good fetal movement.   OB History     Gravida  5   Para  2   Term  2   Preterm      AB  2   Living  2      SAB  2   IAB      Ectopic      Multiple      Live Births              Past Medical History:  Diagnosis Date   Kidney stones     Past Surgical History:  Procedure Laterality Date   LITHOTRIPSY  03/2020    Family History  Problem Relation Age of Onset   Heart disease Father    Hyperlipidemia Father    Diabetes Father    Heart attack Father    Migraines Neg Hx     No Known Allergies  No current facility-administered medications on file prior to encounter.   Current Outpatient Medications on File Prior to Encounter  Medication Sig Dispense Refill   acetaminophen (TYLENOL) 500 MG tablet Take 500 mg by mouth every 6 (six) hours as needed for mild pain or headache.     triamcinolone cream (KENALOG) 0.1 % 1 application Externally Twice a day for 14 days     metoCLOPramide (REGLAN) 10 MG tablet Take 1 tablet (10 mg total) by mouth every 6 (six) hours. (Patient not taking: Reported on  03/17/2021) 30 tablet 0   Prenatal Vit-Fe Fumarate-FA (PRENATAL VITAMINS PO) Take 1 tablet by mouth daily.     pyridOXINE (VITAMIN B-6) 50 MG tablet Take 50 mg by mouth 2 times daily at 12 noon and 4 pm.     tamsulosin (FLOMAX) 0.4 MG CAPS capsule tamsulosin 0.4 mg capsule  TAKE 1 CAPSULE (0.4 MG TOTAL) BY MOUTH DAILY AFTER SUPPER.       ROS Pertinent positives and negative per HPI, all others reviewed and negative  Physical Exam   BP 117/76   Pulse 73   Temp 98.4 F (36.9 C) (Oral)   Resp 18   Ht 5\' 6"  (1.676 m)   Wt 87 kg   LMP 12/29/2020   SpO2 98%   BMI 30.97 kg/m   Patient Vitals for the past 24 hrs:  BP Temp Temp src Pulse Resp SpO2 Height Weight  08/08/21 2200 117/76 -- -- 73 -- 98 % -- --  08/08/21 2146 (!) 124/59 -- -- 78 -- -- -- --  08/08/21 2130 134/74 -- -- 76 -- 98 % -- --  08/08/21 2115 130/69 -- -- 77 -- 97 % -- --  08/08/21 2100 135/69 -- -- 79 -- -- -- --  08/08/21 2032 (!) 146/71 -- -- 77 -- -- -- --  08/08/21 2019 (!) 144/73 -- -- 78 -- -- -- --  08/08/21 1952 (!) 141/81 98.4 F (36.9 C) Oral 84 18 98 % 5\' 6"  (1.676 m) 87 kg    Physical Exam Vitals and nursing note reviewed.  Constitutional:      General: She is not in acute distress.    Appearance: Normal appearance. She is not ill-appearing.  HENT:     Head: Normocephalic and atraumatic.  Eyes:     General: No scleral icterus.    Conjunctiva/sclera: Conjunctivae normal.  Pulmonary:     Effort: Pulmonary effort is normal. No respiratory distress.     Breath sounds: Normal breath sounds.  Abdominal:     Tenderness: There is no right CVA tenderness, left CVA tenderness or guarding.  Skin:    General: Skin is warm and dry.  Neurological:     Mental Status: She is alert.  Psychiatric:        Mood and Affect: Mood normal.        Behavior: Behavior normal.        FHT Baseline 140, moderate variability, 15x15 accels, no decels Toco: irregular Cat: 1  Labs Results for orders placed or  performed during the hospital encounter of 08/08/21 (from the past 24 hour(s))  Urinalysis, Routine w reflex microscopic Urine, Clean Catch     Status: Abnormal   Collection Time: 08/08/21  8:20 PM  Result Value Ref Range   Color, Urine YELLOW YELLOW   APPearance HAZY (A) CLEAR   Specific Gravity, Urine 1.010 1.005 - 1.030   pH 7.0 5.0 - 8.0   Glucose, UA NEGATIVE NEGATIVE mg/dL   Hgb urine dipstick NEGATIVE NEGATIVE   Bilirubin Urine NEGATIVE NEGATIVE   Ketones, ur 80 (A) NEGATIVE mg/dL   Protein, ur NEGATIVE NEGATIVE mg/dL   Nitrite NEGATIVE NEGATIVE   Leukocytes,Ua NEGATIVE NEGATIVE  Protein / creatinine ratio, urine     Status: Abnormal   Collection Time: 08/08/21  8:20 PM  Result Value Ref Range   Creatinine, Urine 53.01 mg/dL   Total Protein, Urine 20 mg/dL   Protein Creatinine Ratio 0.38 (H) 0.00 - 0.15 mg/mg[Cre]  CBC     Status: Abnormal   Collection Time: 08/08/21  8:40 PM  Result Value Ref Range   WBC 7.9 4.0 - 10.5 K/uL   RBC 3.11 (L) 3.87 - 5.11 MIL/uL   Hemoglobin 10.3 (L) 12.0 - 15.0 g/dL   HCT 08/10/21 (L) 29.4 - 76.5 %   MCV 94.5 80.0 - 100.0 fL   MCH 33.1 26.0 - 34.0 pg   MCHC 35.0 30.0 - 36.0 g/dL   RDW 46.5 03.5 - 46.5 %   Platelets 264 150 - 400 K/uL   nRBC 0.0 0.0 - 0.2 %  Comprehensive metabolic panel     Status: Abnormal   Collection Time: 08/08/21  8:40 PM  Result Value Ref Range   Sodium 136 135 - 145 mmol/L   Potassium 2.4 (LL) 3.5 - 5.1 mmol/L   Chloride 103 98 - 111 mmol/L   CO2 24 22 - 32 mmol/L   Glucose, Bld 150 (H) 70 - 99 mg/dL   BUN <5 (L) 6 - 20 mg/dL   Creatinine, Ser 08/10/21 0.44 - 1.00 mg/dL   Calcium 8.5 (L) 8.9 - 10.3 mg/dL   Total Protein 6.1 (L) 6.5 - 8.1 g/dL  Albumin 2.8 (L) 3.5 - 5.0 g/dL   AST 24 15 - 41 U/L   ALT 14 0 - 44 U/L   Alkaline Phosphatase 56 38 - 126 U/L   Total Bilirubin 0.4 0.3 - 1.2 mg/dL   GFR, Estimated >60 >45 mL/min   Anion gap 9 5 - 15  Magnesium     Status: None   Collection Time: 08/08/21  8:40 PM   Result Value Ref Range   Magnesium 1.7 1.7 - 2.4 mg/dL    Imaging US RENAL  Result Date: 08/08/2021 CLINICAL DATA:  Left flank pain. EXAM: RENAL / URINARY TRACT ULTRASOUND COMPLETE COMPARISON:  Renal ultrasound 03/17/2021 FINDINGS: Right Kidney: Renal measurements: 11.2 x 4.7 x 4.2 cm = volume: 170 mL. Echogenicity within normal limits. No mass or hydronephrosis visualized. Left Kidney: Renal measurements: 12.3 by 6.1 x 5.2 cm = volume: 204 mL. Echogenicity within normal limits. No mass or hydronephrosis visualized. Bladder: The bladder is empty. Other: None. IMPRESSION: Normal renal ultrasound. Electronically Signed   By: Darliss Cheney M.D.   On: 08/08/2021 22:33    MAU Course  Procedures Lab Orders         Urinalysis, Routine w reflex microscopic Urine, Clean Catch         CBC         Comprehensive metabolic panel         Protein / creatinine ratio, urine         Magnesium     Meds ordered this encounter  Medications   potassium chloride SA (KLOR-CON M) CR tablet 40 mEq   potassium chloride SA (KLOR-CON M) 20 MEQ tablet    Sig: Take 1 tablet (20 mEq total) by mouth 2 (two) times daily for 4 days.    Dispense:  8 tablet    Refill:  0    Order Specific Question:   Supervising Provider    Answer:   Venora Maples [[id number]]   Imaging Orders         US RENAL      MDM Reviewed scanned prenatal records under media & today's ob visit in care everywhere. Patient ahd severe range BP this morning before 9 am.   In MAU she had 3 elevated BPs, none severe range. She is asymptomatic. Preeclampsia labs reviewed. LFT & platelets normal. Protein creatinine ratio elevated at 0.38 giving diagnosis of preeclampsia.   Incidentally, patient found to be hypokalemic. No recent vomiting or diarrhea. Magnesium level normal. EKG normal. Reviewed with Dr. Vergie Living - will manage outpatient with oral potassium.   Dr. Vincente Poli notified of preeclampsia diagnosis & hypokalemia. Recommends patient to call  office in the morning to schedule f/u appointment on Monday.  Assessment and Plan   1. Pre-eclampsia in third trimester  -Reviewed s/s of severe preeclampsia & reasons to return to MAU -Call office tomorrow morning to schedule f/u appointment on Monday  2. Hypokalemia  -Rx kdur bid x 4 days. Given dose of 40 meq in MAU prior to discharge.   3. Left flank pain  -Negative renal ultrasound -Patient comfortable with at home management of her symptoms & knows when to return (fever, persistent vomiting, worsening pain)  4. [redacted] weeks gestation of pregnancy      Judeth Horn, NP 08/08/21 11:26 PM

## 2021-08-16 ENCOUNTER — Other Ambulatory Visit: Payer: Self-pay

## 2021-08-22 ENCOUNTER — Ambulatory Visit: Payer: Federal, State, Local not specified - PPO | Admitting: *Deleted

## 2021-08-22 ENCOUNTER — Other Ambulatory Visit: Payer: Self-pay | Admitting: *Deleted

## 2021-08-22 ENCOUNTER — Ambulatory Visit (HOSPITAL_BASED_OUTPATIENT_CLINIC_OR_DEPARTMENT_OTHER): Payer: Federal, State, Local not specified - PPO

## 2021-08-22 ENCOUNTER — Ambulatory Visit: Payer: Federal, State, Local not specified - PPO

## 2021-08-22 ENCOUNTER — Ambulatory Visit
Payer: Federal, State, Local not specified - PPO | Attending: Obstetrics and Gynecology | Admitting: Maternal & Fetal Medicine

## 2021-08-22 VITALS — BP 125/70 | HR 74

## 2021-08-22 DIAGNOSIS — Z3A33 33 weeks gestation of pregnancy: Secondary | ICD-10-CM

## 2021-08-22 DIAGNOSIS — O1490 Unspecified pre-eclampsia, unspecified trimester: Secondary | ICD-10-CM

## 2021-08-22 DIAGNOSIS — N200 Calculus of kidney: Secondary | ICD-10-CM

## 2021-08-22 DIAGNOSIS — O09523 Supervision of elderly multigravida, third trimester: Secondary | ICD-10-CM | POA: Diagnosis not present

## 2021-08-22 DIAGNOSIS — O1403 Mild to moderate pre-eclampsia, third trimester: Secondary | ICD-10-CM | POA: Diagnosis not present

## 2021-08-22 DIAGNOSIS — O09293 Supervision of pregnancy with other poor reproductive or obstetric history, third trimester: Secondary | ICD-10-CM | POA: Diagnosis not present

## 2021-08-22 NOTE — Progress Notes (Signed)
Pt c/o daily headache unrelieved by tylenol, blurred vision and floaters twice weekly over the past two weeks and she has +3 pitting edema just above both ankles.

## 2021-08-23 NOTE — Progress Notes (Incomplete)
MFM Consultation  [name] is a 36 yo G5P2 who is seen at 69 w 5d at the request of Dr. Damaris Hippo regarding possible diagnosis and management of preeclampsia.  She is overall doing well today without complaints of uterine contractions. She reports that she has had an intermittent headache and occasional blurry vision. Her headache has improved and does resolve with magnesium supplements recommended by her neurologist.  She has low risk NIPS/and negative AFP.     08/22/2021   10:11 AM 08/08/2021   11:26 PM 08/08/2021   11:24 PM  Vitals with BMI  Systolic 125 133 182  Diastolic 70 78 78  Pulse 74 83       Latest Ref Rng & Units 08/08/2021    8:40 PM 03/17/2021    9:40 PM 02/07/2021    9:41 PM  CBC  WBC 4.0 - 10.5 K/uL 7.9  9.9  10.6   Hemoglobin 12.0 - 15.0 g/dL 99.3  71.6  96.7   Hematocrit 36.0 - 46.0 % 29.4  30.1  37.2   Platelets 150 - 400 K/uL 264  316  319       Latest Ref Rng & Units 08/08/2021    8:40 PM 02/07/2021    9:41 PM  CMP  Glucose 70 - 99 mg/dL 893  810   BUN 6 - 20 mg/dL <5  6   Creatinine 1.75 - 1.00 mg/dL 1.02  5.85   Sodium 277 - 145 mmol/L 136  135   Potassium 3.5 - 5.1 mmol/L 2.4  3.7   Chloride 98 - 111 mmol/L 103  99   CO2 22 - 32 mmol/L 24  28   Calcium 8.9 - 10.3 mg/dL 8.5  9.7   Total Protein 6.5 - 8.1 g/dL 6.1  7.4   Total Bilirubin 0.3 - 1.2 mg/dL 0.4  0.7   Alkaline Phos 38 - 126 U/L 56  43   AST 15 - 41 U/L 24  17   ALT 0 - 44 U/L 14  15    Family History  Problem Relation Age of Onset   Hypertension Father    Heart disease Father    Hyperlipidemia Father    Diabetes Father    Heart attack Father    Migraines Neg Hx    Social History   Socioeconomic History   Marital status: Single    Spouse name: Not on file   Number of children: Not on file   Years of education: Not on file   Highest education level: Not on file  Occupational History   Not on file  Tobacco Use   Smoking status: Former    Types: Cigarettes    Quit  date: 08/18/2002    Years since quitting: 19.0   Smokeless tobacco: Never  Vaping Use   Vaping Use: Never used  Substance and Sexual Activity   Alcohol use: Not Currently    Alcohol/week: 4.0 standard drinks of alcohol    Types: 4 Glasses of wine per week   Drug use: Never   Sexual activity: Not Currently    Partners: Male    Birth control/protection: Abstinence    Comment: 1st intercourse- 12, partners- 25+, current partner- 8 months  Other Topics Concern   Not on file  Social History Narrative   Right handed   Caffeine: none   Lives with boyfriend and children   Social Determinants of Health   Financial Resource Strain: Not on file  Food Insecurity: Not on  file  Transportation Needs: Not on file  Physical Activity: Not on file  Stress: Not on file  Social Connections: Not on file  Intimate Partner Violence: Not on file   Family History  Problem Relation Age of Onset   Hypertension Father    Heart disease Father    Hyperlipidemia Father    Diabetes Father    Heart attack Father    Migraines Neg Hx        Current Outpatient Medications (Analgesics):    acetaminophen (TYLENOL) 500 MG tablet, Take 500 mg by mouth every 6 (six) hours as needed for mild pain or headache.  Current Outpatient Medications (Hematological):    ferrous sulfate 325 (65 FE) MG tablet, Take 325 mg by mouth daily with breakfast.  Current Outpatient Medications (Other):    magnesium gluconate (MAGONATE) 500 MG tablet, Take 500 mg by mouth daily.   Prenatal Vit-Fe Fumarate-FA (PRENATAL VITAMINS PO), Take 1 tablet by mouth daily.   pyridOXINE (VITAMIN B-6) 50 MG tablet, Take 50 mg by mouth 2 times daily at 12 noon and 4 pm.   triamcinolone cream (KENALOG) 0.1 %, 1 application Externally Twice a day for 14 days (Patient not taking: Reported on 08/22/2021) No Known Allergies   Imaging: Single intrauterine pregnancy here for a detailed anatomy due to suspected preeclampsia  Normal anatomy with  measurements consistent with large for dates. There is good fetal movement and mild polyhydramnios Suboptimal views of the fetal anatomy were obtained secondary to fetal position and advanced gestational age.  [name]'s blood pressure is normal today. She has a mild headache that resolved. She also reports kidney pain that is lower than when initially evaluated. She suspects that the renal stone is transtioning.    Impression/Counseling:   I reviewed [name]'s clinical history or labs. She has had normal blood pressure with exceptio for 3 sequential blood pressures in the 140's but they all occurred with 1 hr.   Since that time she has had normal blood pressure and her labs were normal with exception of a UPC that was 0.38.   I discussed with [name] the diagnosis, evaluation and management of preeclampsia. Which includes 2 blood pressure > 4hr apart but < 7 days of 140/90. In addition, CNS symptoms or abnormal labs. She does have an occasional headache but it resolves.  What is complicated her picture is that she also has renal pain that she suggest is a renal stone as she had recent imaging this year noting that she has one but it was not seen in her recent studies. She also have personal experience with nephrolithiasis. I suggest that her elevated blood pressures may be due the pain and her proteinuria may also be caused by nephrolithiasis.  At this time I would not give her the diagnosis of preeclampsia but nephrolithiasis. Consider performing a 24 hour urine. Consider repeat her labs weekly and monitor the resolution of her headaches which has improved since starting magnesium.  Continue serial growth exams.  If her headaches persist and don't resolve, if she has blood pressure meeting the criteria of preeclampsia as defined above. Consider delivery for persistent CNS symptoms that don't resolve or abnormal labs.  If only her blood pressure is elevated and elevated proteinuria  initiate 2x weekly testing and delivery at 37 weeks.  All questions answered.  I spent 30 minutes with > 5% in face to face consultation.  Novella Olive, MD

## 2021-09-05 LAB — OB RESULTS CONSOLE GBS: GBS: NEGATIVE

## 2021-09-23 ENCOUNTER — Telehealth (HOSPITAL_COMMUNITY): Payer: Self-pay | Admitting: *Deleted

## 2021-09-23 NOTE — Telephone Encounter (Signed)
Preadmission screen  

## 2021-09-24 ENCOUNTER — Encounter (HOSPITAL_COMMUNITY): Payer: Self-pay | Admitting: *Deleted

## 2021-09-25 NOTE — H&P (Signed)
Meghan [name] is a 36 y.o. female presenting for IOL s/s polyhdramnios. She had an [place] at 82 wga showed an AFI in the 97%ile (28.6cm). [place] at 43 wga showed LGA infant with EFW of 97%ile (8#1). She has a pelvis proven to 8#6. She was counseled regarding her choices and the risks of IOL. She has a hx of chronic migraines and is followed closely by neurology. She is expecting a RR boy.    OB History     Gravida  5   Para  2   Term  2   Preterm      AB  2   Living  2      SAB  2   IAB      Ectopic      Multiple      Live Births             Past Medical History:  Diagnosis Date   Kidney stones    Past Surgical History:  Procedure Laterality Date   LITHOTRIPSY  03/2020   Family History: family history includes Diabetes in her father; Heart attack in her father; Heart disease in her father; Hyperlipidemia in her father; Hypertension in her father. Social History:  reports that she quit smoking about 19 years ago. Her smoking use included cigarettes. She has never used smokeless tobacco. She reports that she does not currently use alcohol after a past usage of about 4.0 standard drinks of alcohol per week. She reports that she does not use drugs.     Maternal Diabetes: No Genetic Screening: Normal Maternal Ultrasounds/Referrals: Other: see above HPI Fetal Ultrasounds or other Referrals:  None Maternal Substance Abuse:  No Significant Maternal Medications:  None Significant Maternal Lab Results:  None Number of Prenatal Visits:greater than 3 verified prenatal visits Other Comments:  None  Review of Systems History   Last menstrual period 12/29/2020. Exam Physical Exam  (from office) NAD, A&O NWOB Abd soft, nondistended, gravid  Prenatal labs: ABO, Rh: O/Positive/-- (01/10 0000) Antibody: Negative (01/10 0000) Rubella: Immune (01/10 0000) RPR: Nonreactive (01/10 0000)  HBsAg: Negative (01/10 0000)  HIV: Non-reactive (01/10 0000)  GBS: Negative/--  (07/20 0000)   Assessment/Plan: 36 yo [id number] @ 39wga presenting for IOL s/s polyhydramnios. Also, LGA. She has been counseled regarding the risks of both. She declines CS and elects trying vaginally and understands risks of should dystocia, cesarean section, etc.  Cervix unfavorable. Plan for cytotec followed by pitocin/AROM when more favorable.  GBS negative.     Ranae Pila 09/25/2021, 4:31 PM

## 2021-09-30 ENCOUNTER — Inpatient Hospital Stay (HOSPITAL_COMMUNITY)
Admission: AD | Admit: 2021-09-30 | Discharge: 2021-10-02 | DRG: 807 | Disposition: A | Discharge status: Home / Self Care | Payer: Federal, State, Local not specified - PPO | Attending: Obstetrics and Gynecology | Admitting: Obstetrics and Gynecology

## 2021-09-30 ENCOUNTER — Inpatient Hospital Stay (HOSPITAL_COMMUNITY): Payer: Federal, State, Local not specified - PPO | Admitting: Anesthesiology

## 2021-09-30 ENCOUNTER — Encounter (HOSPITAL_COMMUNITY): Payer: Self-pay | Admitting: Obstetrics and Gynecology

## 2021-09-30 ENCOUNTER — Inpatient Hospital Stay (HOSPITAL_COMMUNITY): Payer: Federal, State, Local not specified - PPO

## 2021-09-30 ENCOUNTER — Other Ambulatory Visit: Payer: Self-pay

## 2021-09-30 DIAGNOSIS — Z87891 Personal history of nicotine dependence: Secondary | ICD-10-CM | POA: Diagnosis not present

## 2021-09-30 DIAGNOSIS — O3663X0 Maternal care for excessive fetal growth, third trimester, not applicable or unspecified: Secondary | ICD-10-CM | POA: Diagnosis present

## 2021-09-30 DIAGNOSIS — Z3A39 39 weeks gestation of pregnancy: Secondary | ICD-10-CM

## 2021-09-30 DIAGNOSIS — O403XX0 Polyhydramnios, third trimester, not applicable or unspecified: Secondary | ICD-10-CM | POA: Diagnosis present

## 2021-09-30 DIAGNOSIS — Z3490 Encounter for supervision of normal pregnancy, unspecified, unspecified trimester: Principal | ICD-10-CM

## 2021-09-30 LAB — TYPE AND SCREEN
ABO/RH(D): O POS
Antibody Screen: NEGATIVE

## 2021-09-30 LAB — RPR: RPR Ser Ql: NONREACTIVE

## 2021-09-30 LAB — CBC
HCT: 30.2 % — ABNORMAL LOW (ref 36.0–46.0)
Hemoglobin: 9.9 g/dL — ABNORMAL LOW (ref 12.0–15.0)
MCH: 29.7 pg (ref 26.0–34.0)
MCHC: 32.8 g/dL (ref 30.0–36.0)
MCV: 90.7 fL (ref 80.0–100.0)
Platelets: 295 10*3/uL (ref 150–400)
RBC: 3.33 MIL/uL — ABNORMAL LOW (ref 3.87–5.11)
RDW: 12.9 % (ref 11.5–15.5)
WBC: 7.4 10*3/uL (ref 4.0–10.5)
nRBC: 0 % (ref 0.0–0.2)

## 2021-09-30 MED ORDER — OXYTOCIN-SODIUM CHLORIDE 30-0.9 UT/500ML-% IV SOLN: 2.5000 [IU]/h | INTRAVENOUS | Status: DC

## 2021-09-30 MED ORDER — LACTATED RINGERS IV SOLN
INTRAVENOUS | Status: DC
Start: 2021-09-30 — End: 2021-09-30

## 2021-09-30 MED ORDER — SOD CITRATE-CITRIC ACID 500-334 MG/5ML PO SOLN
30.0000 mL | ORAL | Status: DC | PRN
Start: 2021-09-30 — End: 2021-09-30

## 2021-09-30 MED ORDER — DIBUCAINE (PERIANAL) 1 % EX OINT: 1.0000 | TOPICAL_OINTMENT | CUTANEOUS | Status: DC | PRN

## 2021-09-30 MED ORDER — LIDOCAINE HCL (PF) 1 % IJ SOLN
INTRAMUSCULAR | Status: DC | PRN
  Administered 2021-09-30: 5 mL via EPIDURAL

## 2021-09-30 MED ORDER — ONDANSETRON HCL 4 MG/2ML IJ SOLN: 4.0000 mg | Freq: Four times a day (QID) | INTRAMUSCULAR | Status: DC | PRN

## 2021-09-30 MED ORDER — ACETAMINOPHEN 325 MG PO TABS
650.0000 mg | ORAL_TABLET | ORAL | Status: DC | PRN
  Administered 2021-09-30 – 2021-10-01 (×4): 650 mg via ORAL
  Filled 2021-09-30 (×4): qty 2

## 2021-09-30 MED ORDER — COCONUT OIL OIL: 1.0000 | TOPICAL_OIL | Status: DC | PRN

## 2021-09-30 MED ORDER — LACTATED RINGERS IV SOLN: 500.0000 mL | Freq: Once | INTRAVENOUS | Status: DC

## 2021-09-30 MED ORDER — ACETAMINOPHEN 325 MG PO TABS: 650.0000 mg | ORAL_TABLET | ORAL | Status: DC | PRN

## 2021-09-30 MED ORDER — FENTANYL CITRATE (PF) 100 MCG/2ML IJ SOLN
50.0000 ug | INTRAMUSCULAR | Status: DC | PRN
  Administered 2021-09-30 (×2): 50 ug via INTRAVENOUS
  Filled 2021-09-30 (×2): qty 2

## 2021-09-30 MED ORDER — LACTATED RINGERS IV SOLN: 500.0000 mL | INTRAVENOUS | Status: DC | PRN

## 2021-09-30 MED ORDER — OXYCODONE HCL 5 MG PO TABS
5.0000 mg | ORAL_TABLET | ORAL | Status: DC | PRN
  Administered 2021-09-30 – 2021-10-01 (×2): 5 mg via ORAL
  Filled 2021-09-30 (×2): qty 1

## 2021-09-30 MED ORDER — WITCH HAZEL-GLYCERIN EX PADS: 1.0000 | MEDICATED_PAD | CUTANEOUS | Status: DC | PRN

## 2021-09-30 MED ORDER — MISOPROSTOL 50MCG HALF TABLET
50.0000 ug | ORAL_TABLET | ORAL | Status: DC
  Administered 2021-09-30: 50 ug via ORAL
  Filled 2021-09-30: qty 1

## 2021-09-30 MED ORDER — FENTANYL-BUPIVACAINE-NACL 0.5-0.125-0.9 MG/250ML-% EP SOLN
12.0000 mL/h | EPIDURAL | Status: DC | PRN
  Filled 2021-09-30: qty 250

## 2021-09-30 MED ORDER — PRENATAL MULTIVITAMIN CH
1.0000 | ORAL_TABLET | Freq: Every day | ORAL | Status: DC
Start: 2021-10-01 — End: 2021-10-02
  Administered 2021-10-01: 1 via ORAL
  Filled 2021-09-30: qty 1

## 2021-09-30 MED ORDER — LIDOCAINE HCL (PF) 1 % IJ SOLN
30.0000 mL | INTRAMUSCULAR | Status: DC | PRN
Start: 2021-09-30 — End: 2021-09-30

## 2021-09-30 MED ORDER — OXYTOCIN BOLUS FROM INFUSION
333.0000 mL | Freq: Once | INTRAVENOUS | Status: AC
  Administered 2021-09-30: 333 mL via INTRAVENOUS

## 2021-09-30 MED ORDER — OXYCODONE HCL 5 MG PO TABS
10.0000 mg | ORAL_TABLET | ORAL | Status: DC | PRN
  Administered 2021-10-02: 10 mg via ORAL
  Filled 2021-09-30: qty 2

## 2021-09-30 MED ORDER — TERBUTALINE SULFATE 1 MG/ML IJ SOLN: 0.2500 mg | Freq: Once | INTRAMUSCULAR | Status: DC | PRN

## 2021-09-30 MED ORDER — DIPHENHYDRAMINE HCL 25 MG PO CAPS: 25.0000 mg | ORAL_CAPSULE | Freq: Four times a day (QID) | ORAL | Status: DC | PRN

## 2021-09-30 MED ORDER — OXYCODONE-ACETAMINOPHEN 5-325 MG PO TABS: 1.0000 | ORAL_TABLET | ORAL | Status: DC | PRN

## 2021-09-30 MED ORDER — PHENYLEPHRINE 80 MCG/ML (10ML) SYRINGE FOR IV PUSH (FOR BLOOD PRESSURE SUPPORT): 80.0000 ug | PREFILLED_SYRINGE | INTRAVENOUS | Status: DC | PRN

## 2021-09-30 MED ORDER — ZOLPIDEM TARTRATE 5 MG PO TABS: 5.0000 mg | ORAL_TABLET | Freq: Every evening | ORAL | Status: DC | PRN

## 2021-09-30 MED ORDER — BENZOCAINE-MENTHOL 20-0.5 % EX AERO
1.0000 | INHALATION_SPRAY | CUTANEOUS | Status: DC | PRN
  Administered 2021-10-02: 1 via TOPICAL
  Filled 2021-09-30 (×2): qty 56

## 2021-09-30 MED ORDER — SIMETHICONE 80 MG PO CHEW: 80.0000 mg | CHEWABLE_TABLET | ORAL | Status: DC | PRN

## 2021-09-30 MED ORDER — EPHEDRINE 5 MG/ML INJ: 10.0000 mg | INTRAVENOUS | Status: DC | PRN

## 2021-09-30 MED ORDER — HYDROXYZINE HCL 50 MG PO TABS: 50.0000 mg | ORAL_TABLET | Freq: Four times a day (QID) | ORAL | Status: DC | PRN

## 2021-09-30 MED ORDER — ONDANSETRON HCL 4 MG PO TABS: 4.0000 mg | ORAL_TABLET | ORAL | Status: DC | PRN

## 2021-09-30 MED ORDER — OXYCODONE-ACETAMINOPHEN 5-325 MG PO TABS: 2.0000 | ORAL_TABLET | ORAL | Status: DC | PRN

## 2021-09-30 MED ORDER — OXYTOCIN-SODIUM CHLORIDE 30-0.9 UT/500ML-% IV SOLN
1.0000 m[IU]/min | INTRAVENOUS | Status: DC
  Administered 2021-09-30: 2 m[IU]/min via INTRAVENOUS
  Filled 2021-09-30: qty 500

## 2021-09-30 MED ORDER — MAGNESIUM GLUCONATE 500 MG PO TABS
500.0000 mg | ORAL_TABLET | Freq: Every day | ORAL | Status: DC
  Administered 2021-09-30 – 2021-10-01 (×2): 500 mg via ORAL
  Filled 2021-09-30 (×3): qty 1

## 2021-09-30 MED ORDER — TETANUS-DIPHTH-ACELL PERTUSSIS 5-2.5-18.5 LF-MCG/0.5 IM SUSY: 0.5000 mL | PREFILLED_SYRINGE | Freq: Once | INTRAMUSCULAR | Status: DC

## 2021-09-30 MED ORDER — ONDANSETRON HCL 4 MG/2ML IJ SOLN: 4.0000 mg | INTRAMUSCULAR | Status: DC | PRN

## 2021-09-30 MED ORDER — IBUPROFEN 600 MG PO TABS
600.0000 mg | ORAL_TABLET | Freq: Four times a day (QID) | ORAL | Status: DC
  Administered 2021-09-30 – 2021-10-02 (×7): 600 mg via ORAL
  Filled 2021-09-30 (×6): qty 1

## 2021-09-30 MED ORDER — DIPHENHYDRAMINE HCL 50 MG/ML IJ SOLN: 12.5000 mg | INTRAMUSCULAR | Status: DC | PRN

## 2021-09-30 MED ORDER — SENNOSIDES-DOCUSATE SODIUM 8.6-50 MG PO TABS
2.0000 | ORAL_TABLET | ORAL | Status: DC
Start: 2021-10-01 — End: 2021-10-02
  Administered 2021-10-01 – 2021-10-02 (×2): 2 via ORAL
  Filled 2021-09-30 (×2): qty 2

## 2021-09-30 MED ORDER — FENTANYL-BUPIVACAINE-NACL 0.5-0.125-0.9 MG/250ML-% EP SOLN
EPIDURAL | Status: DC | PRN
  Administered 2021-09-30: 12 mL/h via EPIDURAL

## 2021-09-30 NOTE — Progress Notes (Signed)
No updates to H&P. S/p cytotec x 1 dose. Now on pitocin.  SVE 1.5/60/0, AROM clear fluid with head very well applied. EFW on leopolds 8#8. Continue IOL.

## 2021-09-30 NOTE — Anesthesia Preprocedure Evaluation (Addendum)
Anesthesia Evaluation  Patient identified by MRN, date of birth, ID band Patient awake    Reviewed: Allergy & Precautions, NPO status , Patient's Chart, lab work & pertinent test results  Airway Mallampati: II  TM Distance: >3 FB Neck ROM: Full    Dental no notable dental hx. (+) Teeth Intact, Dental Advisory Given   Pulmonary neg pulmonary ROS, former smoker,    Pulmonary exam normal breath sounds clear to auscultation       Cardiovascular negative cardio ROS Normal cardiovascular exam Rhythm:Regular Rate:Normal     Neuro/Psych negative neurological ROS  negative psych ROS   GI/Hepatic negative GI ROS, Neg liver ROS,   Endo/Other  negative endocrine ROS  Renal/GU      Musculoskeletal   Abdominal   Peds  Hematology  (+) Blood dyscrasia, anemia , Lab Results      Component                Value               Date                      WBC                      7.4                 09/30/2021                HGB                      9.9 (L)             09/30/2021                HCT                      30.2 (L)            09/30/2021                MCV                      90.7                09/30/2021                PLT                      295                 09/30/2021              Anesthesia Other Findings   Reproductive/Obstetrics (+) Pregnancy                            Anesthesia Physical Anesthesia Plan  ASA: 3  Anesthesia Plan: Epidural   Post-op Pain Management:    Induction:   PONV Risk Score and Plan:   Airway Management Planned:   Additional Equipment: None  Intra-op Plan:   Post-operative Plan:   Informed Consent: I have reviewed the patients History and Physical, chart, labs and discussed the procedure including the risks, benefits and alternatives for the proposed anesthesia with the patient or authorized representative who has indicated his/her understanding and  acceptance.       Plan Discussed with:   Anesthesia  Plan Comments: (39.2 wk G5P2 w Anenia , fetal macrosomia and Poly for LEA)       Anesthesia Quick Evaluation

## 2021-09-30 NOTE — Lactation Note (Addendum)
This note was copied from a baby's chart. Lactation Consultation Note  Patient Name: Meghan [name] [id number] Date: 09/30/2021 Reason for consult: L&D Initial assessment;Exclusive pumping and bottle feeding Age:38 hours  P3, Baby sleeping skin to skin. Birth parent would like to pump and bottle feed. Options presented to supplement with donor milk or formula also.  Patient to let RN know on MBU. Brought her Lanisoh pump to the hospital but may use hospital pump.   Interventions Interventions: Education  Consult Status Consult Status: Follow-up from L&D    Dahlia Byes [facility] 09/30/2021, 6:10 PM

## 2021-09-30 NOTE — Progress Notes (Signed)
Comf with cle. Continues to leak clear fluid.  Sve 4/70/-1 Iupc placed s/s inability to monitor contractions well.  Continue Pitocin.

## 2021-09-30 NOTE — Lactation Note (Signed)
This note was copied from a baby's chart. Lactation Consultation Note  Patient Name: Meghan [name] [id number] Date: 09/30/2021   Age:38 hours LC reached out to RN, Doran Heater to set up DEBP. Birth parent expressed interest to pump and bottle feed on L and D.  RN not available.  Maternal Data    Feeding Nipple Type: Slow - flow  LATCH Score                    Lactation Tools Discussed/Used    Interventions    Discharge Pump: Personal;DEBP  Consult Status      Meghan [employer]le  [name] 09/30/2021, 11:03 PM

## 2021-09-30 NOTE — Anesthesia Procedure Notes (Signed)
Epidural Patient location during procedure: OB Start time: 09/30/2021 9:06 AM End time: 09/30/2021 9:19 AM  Staffing Anesthesiologist: Trevor Iha, MD Performed: anesthesiologist   Preanesthetic Checklist Completed: patient identified, IV checked, site marked, risks and benefits discussed, surgical consent, monitors and equipment checked, pre-op evaluation and timeout performed  Epidural Patient position: sitting Prep: DuraPrep and site prepped and draped Patient monitoring: continuous pulse ox and blood pressure Approach: midline Location: L3-L4 Injection technique: LOR air  Needle:  Needle type: Tuohy  Needle gauge: 17 G Needle length: 9 cm and 9 Needle insertion depth: 5 cm cm Catheter type: closed end flexible Catheter size: 19 Gauge Catheter at skin depth: 11 cm Test dose: negative  Assessment Events: blood not aspirated, injection not painful, no injection resistance, no paresthesia and negative IV test  Additional Notes Patient identified. Risks/Benefits/Options discussed with patient including but not limited to bleeding, infection, nerve damage, paralysis, failed block, incomplete pain control, headache, blood pressure changes, nausea, vomiting, reactions to medication both or allergic, itching and postpartum back pain. Confirmed with bedside nurse the patient's most recent platelet count. Confirmed with patient that they are not currently taking any anticoagulation, have any bleeding history or any family history of bleeding disorders. Patient expressed understanding and wished to proceed. All questions were answered. Sterile technique was used throughout the entire procedure. Please see nursing notes for vital signs. Test dose was given through epidural needle and negative prior to continuing to dose epidural or start infusion. Warning signs of high block given to the patient including shortness of breath, tingling/numbness in hands, complete motor block, or any  concerning symptoms with instructions to call for help. Patient was given instructions on fall risk and not to get out of bed. All questions and concerns addressed with instructions to call with any issues.  1 Attempt (S) . Patient tolerated procedure well.

## 2021-10-01 LAB — CBC
HCT: 29.2 % — ABNORMAL LOW (ref 36.0–46.0)
Hemoglobin: 9.7 g/dL — ABNORMAL LOW (ref 12.0–15.0)
MCH: 30.2 pg (ref 26.0–34.0)
MCHC: 33.2 g/dL (ref 30.0–36.0)
MCV: 91 fL (ref 80.0–100.0)
Platelets: 282 10*3/uL (ref 150–400)
RBC: 3.21 MIL/uL — ABNORMAL LOW (ref 3.87–5.11)
RDW: 12.9 % (ref 11.5–15.5)
WBC: 11.4 10*3/uL — ABNORMAL HIGH (ref 4.0–10.5)
nRBC: 0 % (ref 0.0–0.2)

## 2021-10-01 NOTE — Anesthesia Postprocedure Evaluation (Signed)
Anesthesia Post Note  Patient: Meghan [name]  Procedure(s) Performed: AN AD HOC LABOR EPIDURAL     Patient location during evaluation: Mother Baby Anesthesia Type: Epidural Level of consciousness: awake and alert Pain management: pain level controlled Vital Signs Assessment: post-procedure vital signs reviewed and stable Respiratory status: spontaneous breathing, nonlabored ventilation and respiratory function stable Cardiovascular status: stable Postop Assessment: no headache, no backache and epidural receding Anesthetic complications: no   No notable events documented.  Last Vitals:  Vitals:   10/01/21 0130 10/01/21 0526  BP: 118/70 135/82  Pulse: 72 71  Resp: 18 18  Temp: 36.9 C 36.9 C  SpO2: 96% 97%    Last Pain:  Vitals:   10/01/21 0527  TempSrc:   PainSc: 4    Pain Goal:                   Lattie Cervi

## 2021-10-01 NOTE — Progress Notes (Signed)
Post Partum Day 1 Subjective: no complaints, up ad lib, voiding, and tolerating PO.  Requests circ.  Objective: Blood pressure 135/82, pulse 71, temperature 98.5 F (36.9 C), temperature source Oral, resp. rate 18, height 5\' 6"  (1.676 m), weight 89.6 kg, last menstrual period 12/29/2020, SpO2 97 %, unknown if currently breastfeeding.  Physical Exam:  General: alert, cooperative, and appears stated age Lochia: appropriate Uterine Fundus: firm Incision: n/a DVT Evaluation: No evidence of DVT seen on physical exam. Negative Homan's sign. No cords or calf tenderness.  Recent Labs    09/30/21 0105 10/01/21 0705  HGB 9.9* 9.7*  HCT 30.2* 29.2*    Assessment/Plan: Plan for discharge tomorrow and Circumcision prior to discharge Circ-patient is counseled re: risk of circ to include bleeding, infection and scarring.  All questions were answered.   LOS: 1 day   10/03/21, DO 10/01/2021, 8:44 AM

## 2021-10-01 NOTE — Lactation Note (Signed)
This note was copied from a baby's chart. Lactation Consultation Note  Patient Name: Meghan [name] [id number] Date: 10/01/2021 Reason for consult: Follow-up assessment;Mother's request;Exclusive pumping and bottle feeding;Term;Breastfeeding assistance Age:38 hours Birth parent in pain due to epidural and lower abdomen. LC alerted RN.  Once birth parent in better position in the bed, we were able to access flange size and set the dEBP and comfortable setting. Birth parent has sensitive nipples and using personal coconut oil prior to pumping.   LC reviewed feeding volumes infant only bottle feeding with DBM. Volume guidelines provided.  Birth parent to post pump after each feeding for 15 mins  All questions answered at the end of the visit.  Birth parent interested in following up with outpatient lactation services after discharge.  Maternal Data Has patient been taught Hand Expression?: Yes  Feeding Mother's Current Feeding Choice: Breast Milk and Donor Milk Nipple Type: Slow - flow  LATCH Score                    Lactation Tools Discussed/Used Tools: Pump;Flanges (Birth parent using coconut oil prior to pumping.) Flange Size: 24 Breast pump type: Double-Electric Breast Pump Pump Education: Setup, frequency, and cleaning;Milk Storage Reason for Pumping: increase stimlation Pumping frequency: post pump after each feeding for 15 mins  Interventions Interventions: Breast feeding basics reviewed;Hand express;Expressed milk;Coconut oil;DEBP;Education;Infant Driven Feeding Algorithm education  Discharge Pump: DEBP  Consult Status Consult Status: Follow-up Date: 10/02/21 Follow-up type: In-patient    Nader Boys  Nicholson-Springer 10/01/2021, 9:21 PM

## 2021-10-02 MED ORDER — OXYCODONE HCL 5 MG PO TABS
5.0000 mg | ORAL_TABLET | ORAL | 0 refills | Status: AC | PRN
Start: 2021-10-02 — End: ?

## 2021-10-02 NOTE — Lactation Note (Signed)
This note was copied from a baby's chart. Lactation Consultation Note  Patient Name: Meghan [name] [id number] Date: 10/02/2021 Reason for consult: Follow-up assessment Age:38 hours   P3: Term infant at 39+2 weeks Feeding preference: Pump and bottle feed; using donor breast milk in the hospital  Birth parent had no questions/concerns related to pumping.  Informed me that she is not "getting much" currently; reassurance given.  Encouraged to continue breast massage, hand expression and to pump consistently every three hours during the day/evening.  She could have one 4 hour stretch in the night for extra sleep.  Discussed always feeding her expressed milk first and to not mix breast milk with formula.  Birth parent verbalized understanding.  Family has our op phone number for any further concerns.  Support person present.   Maternal Data    Feeding    LATCH Score                    Lactation Tools Discussed/Used    Interventions Interventions: Education  Discharge Discharge Education: Engorgement and breast care Pump: Personal  Consult Status Consult Status: Complete Date: 10/02/21 Follow-up type: Call as needed    [name] R Symir Mah 10/02/2021, 11:16 AM

## 2021-10-02 NOTE — Discharge Summary (Signed)
Postpartum Discharge Summary  Date of Service October 02, 2021     Patient Name: Meghan [name] DOB: 1985/09/24 MRN: [id number]  Date of admission: 09/30/2021 Delivery date:09/30/2021  Delivering provider: Tyson Dense  Date of discharge: 10/02/2021  Admitting diagnosis: Pregnancy [Z34.90] Intrauterine pregnancy: [redacted]w[redacted]d    Secondary diagnosis:  Principal Problem:   Pregnancy  Additional problems: none    Discharge diagnosis: polyhydramnios                                            Post partum procedures: none Augmentation: AROM, Pitocin, and Cytotec Complications: None  Hospital course: Induction of Labor With Vaginal Delivery   36y.o. yo [id number] admitted to the hospital 09/30/2021 for induction of labor.  Indication for induction:  polyhydramnios .  Patient had an uncomplicated labor course as follows: Membrane Rupture Time/Date: 7:53 AM ,09/30/2021   Delivery Method:Vaginal, Spontaneous  Episiotomy: None  Lacerations:  None  Details of delivery can be found in separate delivery note.  Patient had a routine postpartum course. Patient is discharged home 10/02/21.  Newborn Data: Birth date:09/30/2021  Birth time:5:16 PM  Gender:Female  Living status:Living  Apgars:8 ,9  Weight:3760 g   Magnesium Sulfate received: No BMZ received: No Rhophylac: no MMR:N/A T-DaP:Given prenatally Flu: N/A Transfusion:No  Physical exam  Vitals:   10/01/21 1000 10/01/21 1158 10/01/21 2005 10/02/21 0450  BP: 134/89 134/84 136/76 132/88  Pulse: 71 68 85 78  Resp: 16 16 16 18   Temp: 98 F (36.7 C) 98 F (36.7 C)  98.3 F (36.8 C)  TempSrc: Oral Oral  Oral  SpO2: 99% 100%  99%  Weight:      Height:       General: alert, cooperative, and no distress Lochia: appropriate Uterine Fundus: firm Incision: Healing well with no significant drainage DVT Evaluation: No evidence of DVT seen on physical exam. Labs: Lab Results  Component Value Date   WBC 11.4 (H)  10/01/2021   HGB 9.7 (L) 10/01/2021   HCT 29.2 (L) 10/01/2021   MCV 91.0 10/01/2021   PLT 282 10/01/2021      Latest Ref Rng & Units 08/08/2021    8:40 PM  CMP  Glucose 70 - 99 mg/dL 150   BUN 6 - 20 mg/dL <5   Creatinine 0.44 - 1.00 mg/dL 0.56   Sodium 135 - 145 mmol/L 136   Potassium 3.5 - 5.1 mmol/L 2.4   Chloride 98 - 111 mmol/L 103   CO2 22 - 32 mmol/L 24   Calcium 8.9 - 10.3 mg/dL 8.5   Total Protein 6.5 - 8.1 g/dL 6.1   Total Bilirubin 0.3 - 1.2 mg/dL 0.4   Alkaline Phos 38 - 126 U/L 56   AST 15 - 41 U/L 24   ALT 0 - 44 U/L 14    Edinburgh Score:     No data to display            After visit meds:  Allergies as of 10/02/2021   No Known Allergies      Medication List     TAKE these medications    acetaminophen 500 MG tablet Commonly known as: TYLENOL Take 500 mg by mouth every 6 (six) hours as needed for mild pain or headache.   ferrous sulfate 325 (65 FE) MG tablet Take 325  mg by mouth daily with breakfast.   magnesium gluconate 500 MG tablet Commonly known as: MAGONATE Take 500 mg by mouth daily.   oxyCODONE 5 MG immediate release tablet Commonly known as: Oxy IR/ROXICODONE Take 1 tablet (5 mg total) by mouth every 4 (four) hours as needed (pain scale 4-7).   PRENATAL VITAMINS PO Take 1 tablet by mouth daily.   pyridOXINE 50 MG tablet Commonly known as: VITAMIN B6 Take 50 mg by mouth 2 times daily at 12 noon and 4 pm.   triamcinolone cream 0.1 % Commonly known as: KENALOG 1 application Externally Twice a day for 14 days         Discharge home in stable condition Infant Feeding: Breast Infant Disposition:home with mother Discharge instruction: per After Visit Summary and Postpartum booklet. Activity: Advance as tolerated. Pelvic rest for 6 weeks.  Diet: routine diet Anticipated Birth Control: Unsure Postpartum Appointment:6 weeks Additional Postpartum F/U:  not applicable Future Appointments:No future appointments. Follow up  Visit:      10/02/2021 Cyril Mourning, MD

## 2021-10-09 ENCOUNTER — Telehealth (HOSPITAL_COMMUNITY): Payer: Self-pay | Admitting: *Deleted

## 2021-10-09 NOTE — Telephone Encounter (Signed)
Mom reports feeling fine. No concerns about herself at this time. EPDS declined [facility] score=0) Mom reports baby is doing well. Feeding, peeing, and pooping without difficulty. Mom reports no concerns about baby at present.  Duffy Rhody, RN 10-09-2021 at 6:25pm

## 2022-04-26 ENCOUNTER — Encounter (HOSPITAL_BASED_OUTPATIENT_CLINIC_OR_DEPARTMENT_OTHER): Payer: Self-pay | Admitting: Emergency Medicine

## 2022-04-26 ENCOUNTER — Emergency Department (HOSPITAL_BASED_OUTPATIENT_CLINIC_OR_DEPARTMENT_OTHER)
Admission: EM | Admit: 2022-04-26 | Discharge: 2022-04-26 | Disposition: A | Discharge status: Home / Self Care | Payer: Federal, State, Local not specified - PPO | Attending: Emergency Medicine | Admitting: Emergency Medicine

## 2022-04-26 ENCOUNTER — Emergency Department (HOSPITAL_BASED_OUTPATIENT_CLINIC_OR_DEPARTMENT_OTHER): Payer: Federal, State, Local not specified - PPO

## 2022-04-26 DIAGNOSIS — N200 Calculus of kidney: Secondary | ICD-10-CM | POA: Diagnosis not present

## 2022-04-26 DIAGNOSIS — R109 Unspecified abdominal pain: Secondary | ICD-10-CM | POA: Diagnosis present

## 2022-04-26 LAB — BASIC METABOLIC PANEL
Anion gap: 10 (ref 5–15)
BUN: 7 mg/dL (ref 6–20)
CO2: 25 mmol/L (ref 22–32)
Calcium: 9.6 mg/dL (ref 8.9–10.3)
Chloride: 102 mmol/L (ref 98–111)
Creatinine, Ser: 0.71 mg/dL (ref 0.44–1.00)
GFR, Estimated: >60 mL/min (ref >60)
Glucose, Bld: 91 mg/dL (ref 70–99)
Potassium: 3.3 mmol/L — ABNORMAL LOW (ref 3.5–5.1)
Sodium: 137 mmol/L (ref 135–145)

## 2022-04-26 LAB — URINALYSIS, ROUTINE W REFLEX MICROSCOPIC
Bacteria, UA: NONE SEEN
Bilirubin Urine: NEGATIVE
Glucose, UA: NEGATIVE mg/dL
Ketones, ur: 15 mg/dL — AB
Leukocytes,Ua: NEGATIVE
Nitrite: NEGATIVE
Protein, ur: NEGATIVE mg/dL
RBC / HPF: >50 RBC/hpf (ref 0–5)
Specific Gravity, Urine: 1.017 (ref 1.005–1.030)
pH: 6 (ref 5.0–8.0)

## 2022-04-26 LAB — CBC
HCT: 37.1 % (ref 36.0–46.0)
Hemoglobin: 12.6 g/dL (ref 12.0–15.0)
MCH: 31.3 pg (ref 26.0–34.0)
MCHC: 34 g/dL (ref 30.0–36.0)
MCV: 92.3 fL (ref 80.0–100.0)
Platelets: 244 10*3/uL (ref 150–400)
RBC: 4.02 MIL/uL (ref 3.87–5.11)
RDW: 12.1 % (ref 11.5–15.5)
WBC: 5.5 10*3/uL (ref 4.0–10.5)
nRBC: 0 % (ref 0.0–0.2)

## 2022-04-26 LAB — PREGNANCY, URINE: Preg Test, Ur: NEGATIVE

## 2022-04-26 MED ORDER — KETOROLAC TROMETHAMINE 10 MG PO TABS: 10.0000 mg | ORAL_TABLET | Freq: Three times a day (TID) | ORAL | 0 refills | Status: AC | PRN

## 2022-04-26 MED ORDER — KETOROLAC TROMETHAMINE 15 MG/ML IJ SOLN
15.0000 mg | Freq: Once | INTRAMUSCULAR | Status: AC
  Administered 2022-04-26: 15 mg via INTRAVENOUS
  Filled 2022-04-26: qty 1

## 2022-04-26 MED ORDER — FENTANYL CITRATE PF 50 MCG/ML IJ SOSY
100.0000 ug | PREFILLED_SYRINGE | Freq: Once | INTRAMUSCULAR | Status: AC
  Administered 2022-04-26: 100 ug via INTRAVENOUS
  Filled 2022-04-26: qty 2

## 2022-04-26 MED ORDER — LACTATED RINGERS IV BOLUS
1000.0000 mL | Freq: Once | INTRAVENOUS | Status: AC
  Administered 2022-04-26: 1000 mL via INTRAVENOUS

## 2022-04-26 MED ORDER — TAMSULOSIN HCL 0.4 MG PO CAPS: 0.4000 mg | ORAL_CAPSULE | Freq: Every day | ORAL | 0 refills | Status: AC

## 2022-04-26 MED ORDER — ONDANSETRON HCL 4 MG PO TABS: 4.0000 mg | ORAL_TABLET | Freq: Four times a day (QID) | ORAL | 0 refills | Status: AC | PRN

## 2022-04-26 NOTE — ED Provider Notes (Signed)
**Meghan [name] De-Identified via Obfuscation** Meghan Meghan [name]   CSN: [id number] Arrival date & time: 04/26/22  1853     History Chief Complaint  Patient presents with   Flank Pain    HPI Meghan Meghan [name] is a 37 y.o. female presenting for chief complaint of flank pain/nausea/vomitting. Patient states that the symptoms have been progressive over the past 5 days.  Gross history of nephrolithiasis, lithotripsy in the past.  Patient's recorded medical, surgical, social, medication list and allergies were reviewed in the Snapshot window as part of the initial history.   Review of Systems   Review of Systems  Constitutional:  Negative for chills and fever.  HENT:  Negative for ear pain and sore throat.   Eyes:  Negative for pain and visual disturbance.  Respiratory:  Negative for cough and shortness of breath.   Cardiovascular:  Negative for chest pain and palpitations.  Gastrointestinal:  Negative for abdominal pain and vomiting.  Genitourinary:  Positive for flank pain. Negative for dysuria and hematuria.  Musculoskeletal:  Negative for arthralgias and back pain.  Skin:  Negative for color change and rash.  Neurological:  Negative for seizures and syncope.  All other systems reviewed and are negative.   Physical Exam Updated Vital Signs BP (!) 147/93   Pulse 66   Temp 98.6 F (37 C) (Oral)   Resp 16   LMP 04/11/2022 (Approximate)   SpO2 100%  Physical Exam Vitals and nursing Meghan [name] reviewed.  Constitutional:      General: She is not in acute distress.    Appearance: She is well-developed.  HENT:     Head: Normocephalic and atraumatic.  Eyes:     Conjunctiva/sclera: Conjunctivae normal.  Cardiovascular:     Rate and Rhythm: Normal rate and regular rhythm.     Heart sounds: No murmur heard. Pulmonary:     Effort: Pulmonary effort is normal. No respiratory distress.     Breath sounds: Normal breath sounds.  Abdominal:     General: There is no distension.      Palpations: Abdomen is soft.     Tenderness: There is no abdominal tenderness. There is left CVA tenderness. There is no right CVA tenderness.  Musculoskeletal:        General: No swelling or tenderness. Normal range of motion.     Cervical back: Neck supple.  Skin:    General: Skin is warm and dry.  Neurological:     General: No focal deficit present.     Mental Status: She is alert and oriented to person, place, and time. Mental status is at baseline.     Cranial Nerves: No cranial nerve deficit.      ED Course/ Medical Decision Making/ A&P    Procedures Procedures   Medications Ordered in ED Medications  ketorolac (TORADOL) 15 MG/ML injection 15 mg (has no administration in time range)  fentaNYL (SUBLIMAZE) injection 100 mcg (100 mcg Intravenous Given 04/26/22 1926)  lactated ringers bolus 1,000 mL (1,000 mLs Intravenous New Bag/Given 04/26/22 1924)    Medical Decision Making:   Meghan Meghan [name] is a 37 y.o. female who presented to the ED today with abdominal pain, detailed above.     Complete initial physical exam performed, notably the patient  was HDS in NAD.     Reviewed and confirmed nursing documentation for past medical history, family history, social history.    Initial Assessment:   With the patient's presentation of abdominal pain, most likely diagnosis is  pyelonephritis versus nephrolithiasis. Other diagnoses were considered including (but not limited to) gastroenteritis, colitis, small bowel obstruction, appendicitis, cholecystitis, pancreatitis,  ovarian torsion. These are considered less likely due to history of present illness and physical exam findings.   This is most consistent with an acute life/limb threatening illness complicated by underlying chronic conditions.   Initial Plan:  CBC/CMP to evaluate for underlying infectious/metabolic etiology for patient's abdominal pain  Lipase to evaluate for pancreatitis  EKG to evaluate for cardiac source of pain  CT  Ab/pelvis without contrast due to favored nephrolithiasis over GI etiology for patient's abdominal pain  Urinalysis and repeat physical assessment to evaluate for UTI/Pyelonpehritis  Empiric management of symptoms with escalating pain control and antiemetics as needed.   Initial Study Results:   Laboratory  All laboratory results reviewed without evidence of clinically relevant pathology.    EKG EKG was reviewed independently. Rate, rhythm, axis, intervals all examined and without medically relevant abnormality. ST segments without concerns for elevations.    Radiology All images reviewed independently. Agree with radiology report at this time.   CT Renal Stone Study  Result Date: 04/26/2022 CLINICAL DATA:  Left flank pain.  Kidney stones suspected EXAM: CT ABDOMEN AND PELVIS WITHOUT CONTRAST TECHNIQUE: Multidetector CT imaging of the abdomen and pelvis was performed following the standard protocol without IV contrast. RADIATION DOSE REDUCTION: This exam was performed according to the departmental dose-optimization program which includes automated exposure control, adjustment of the mA and/or kV according to patient size and/or use of iterative reconstruction technique. COMPARISON:  02/29/2020 FINDINGS: Lower chest: No acute abnormality. Hepatobiliary: Liver, gallbladder, and biliary tree are unremarkable. Pancreas: Unremarkable. Spleen: Unremarkable. Adrenals/Urinary Tract: Normal adrenal glands. Bilateral nephrolithiasis measuring up to 3 mm in the upper pole of the right kidney. Suspected 2 mm ureteral stone versus phlebolith near the left UVJ (series 2/image 71). No hydronephrosis. Unremarkable bladder. Stomach/Bowel: Normal caliber large and small bowel. Moderate colonic stool load. Normal appendix. Unremarkable stomach. Vascular/Lymphatic: No significant vascular findings are present. No enlarged abdominal or pelvic lymph nodes. Reproductive: Uterus and bilateral adnexa are unremarkable. Other:  Trace free fluid in the pelvis.  No free intraperitoneal air. Musculoskeletal: No acute osseous abnormality. IMPRESSION: 1. Suspected 2 mm ureteral stone versus phlebolith near the left UVJ. No significant hydronephrosis. 2. Bilateral nephrolithiasis. Electronically Signed   By: Placido Sou M.D.   On: 04/26/2022 20:19    Final Reassessment and Plan:   2 mm nephrolithiasis identified.  Patient symptoms are completely under control in the emergency room.  Will prescribe patient Flomax, nausea control, p.o. symptom control with Toradol and recommend close follow-up with primary care provider/urology in the outpatient setting. Disposition:  I have considered need for hospitalization, however, considering all of the above, I believe this patient is stable for discharge at this time.  Patient/family educated about specific return precautions for given chief complaint and symptoms.  Patient/family educated about follow-up with PCP .     Patient/family expressed understanding of return precautions and need for follow-up. Patient spoken to regarding all imaging and laboratory results and appropriate follow up for these results. All education provided in verbal form with additional information in written form. Time was allowed for answering of patient questions. Patient discharged.    Emergency Department Medication Summary:   Medications  ketorolac (TORADOL) 15 MG/ML injection 15 mg (has no administration in time range)  fentaNYL (SUBLIMAZE) injection 100 mcg (100 mcg Intravenous Given 04/26/22 1926)  lactated ringers bolus 1,000 mL (1,000 mLs  Intravenous New Bag/Given 04/26/22 1924)            Clinical Impression:  1. Nephrolithiasis      Discharge   Final Clinical Impression(s) / ED Diagnoses Final diagnoses:  Nephrolithiasis    Rx / DC Orders ED Discharge Orders          Ordered    tamsulosin (FLOMAX) 0.4 MG CAPS capsule  Daily        04/26/22 2057    ondansetron (ZOFRAN) 4 MG  tablet  Every 6 hours PRN        04/26/22 2057    ketorolac (TORADOL) 10 MG tablet  Every 8 hours PRN        04/26/22 2057              Tretha Sciara, MD 04/26/22 2058

## 2022-04-26 NOTE — ED Triage Notes (Signed)
Flank pain. Left "Its kidney stones" history  of same Started this week getting worse

## 2022-05-13 IMAGING — US US OB < 14 WEEKS - US OB TV
1 series · 15 of 28 positions shown · non-contrast
Comparison: None.

CLINICAL DATA: Pelvic cramping with positive pregnancy test

EXAM:
OBSTETRIC <14 WK US AND TRANSVAGINAL OB US
TECHNIQUE: Both transabdominal and transvaginal ultrasound examinations were
performed for complete evaluation of the gestation as well as the
maternal uterus, adnexal regions, and pelvic cul-de-sac.
Transvaginal technique was performed to assess early pregnancy.

[Series 1: us ob < 14 weeks - us ob tv · 15 of 54 slices shown]
[im 1/54]
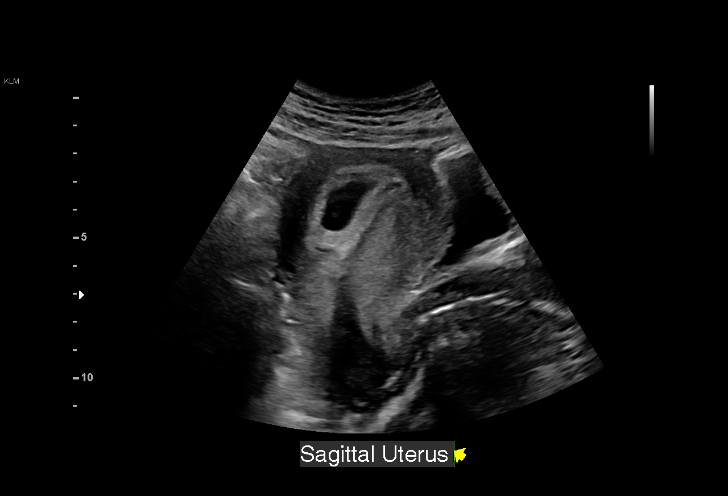
[im 4/54]
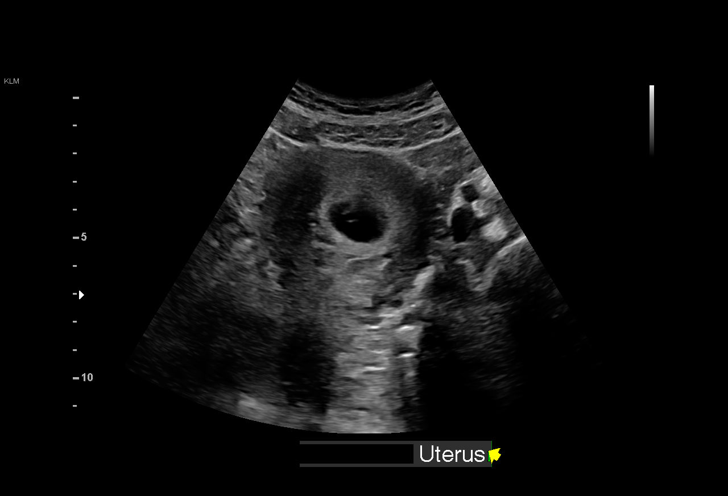
[im 8/54]
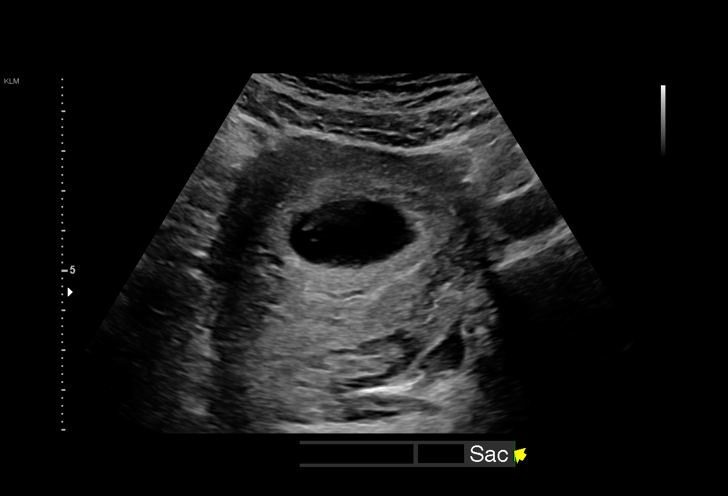
[im 12/54]
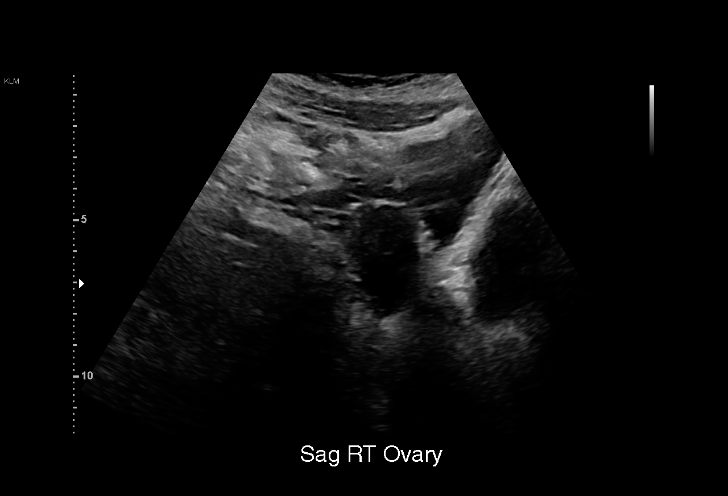
[im 16/54]
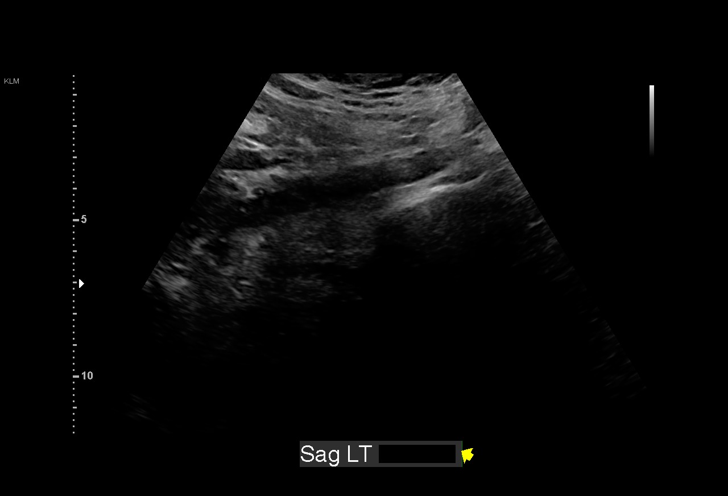
[im 20/54]
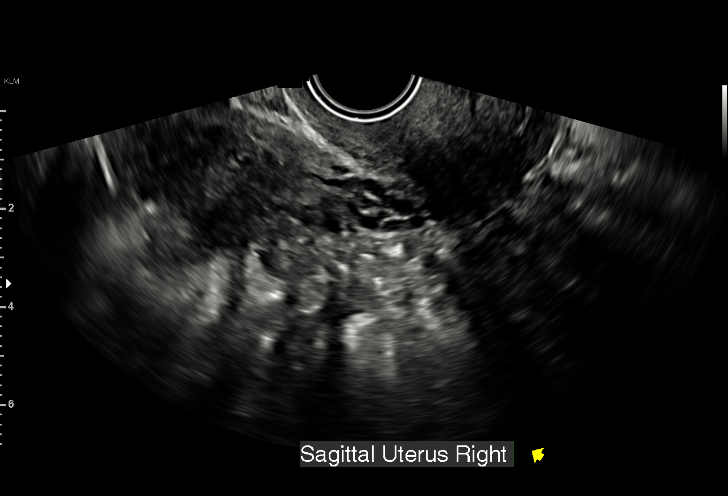
[im 24/54]
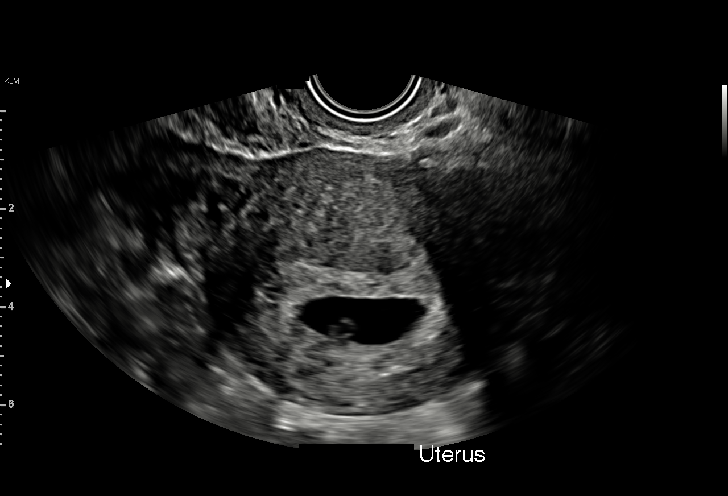
[im 28/54]
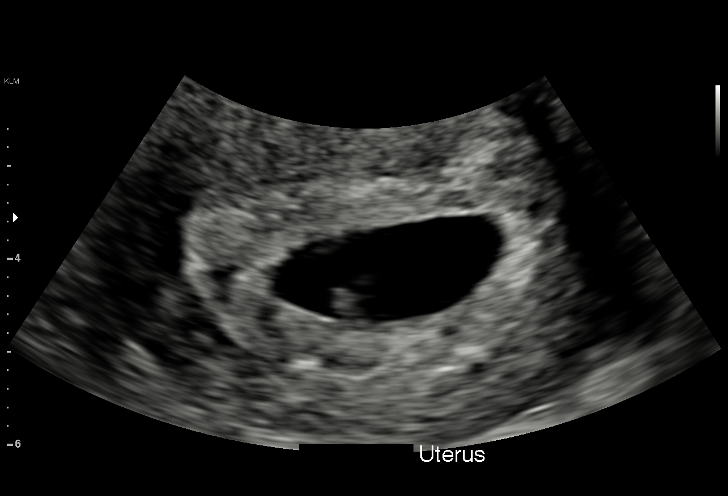
[im 30/54]
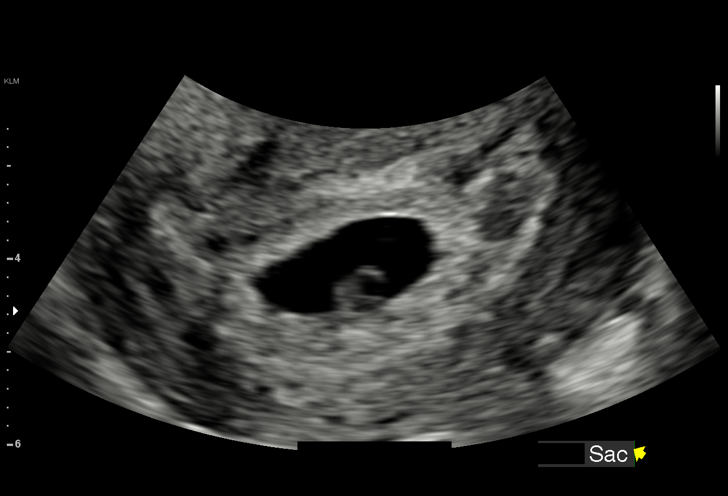
[im 34/54]
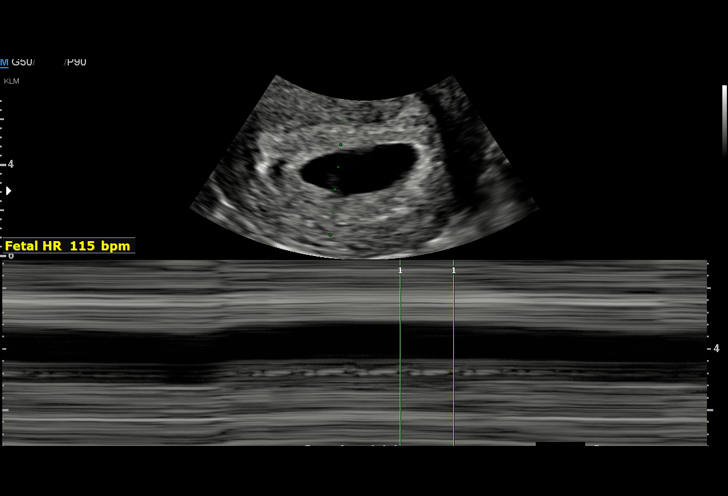
[im 38/54]
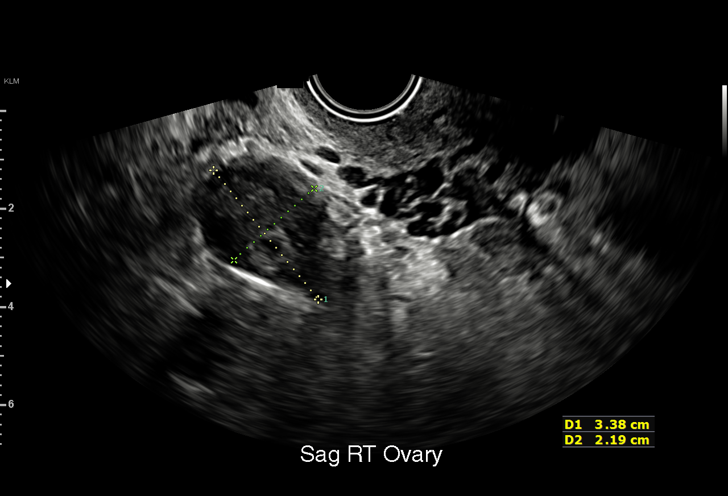
[im 42/54]
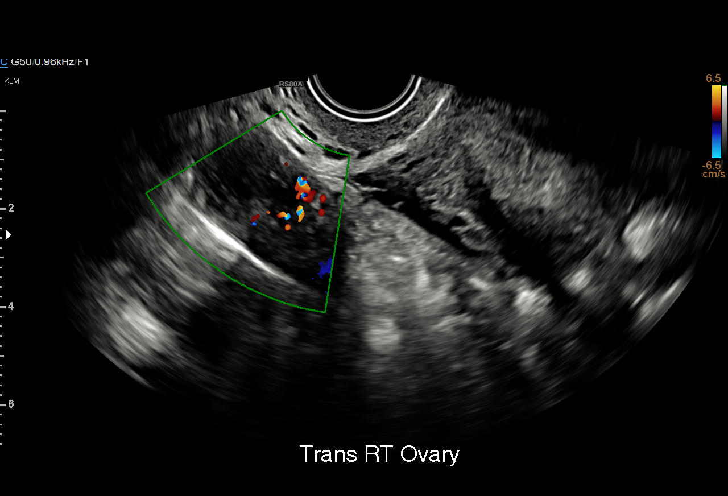
[im 46/54]
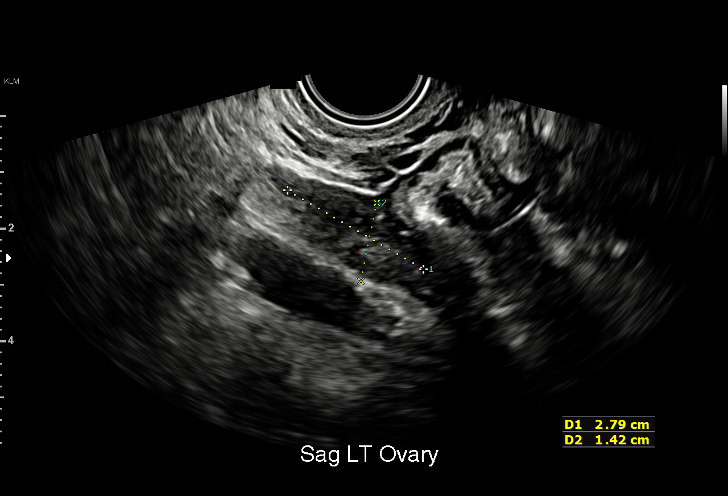
[im 50/54]
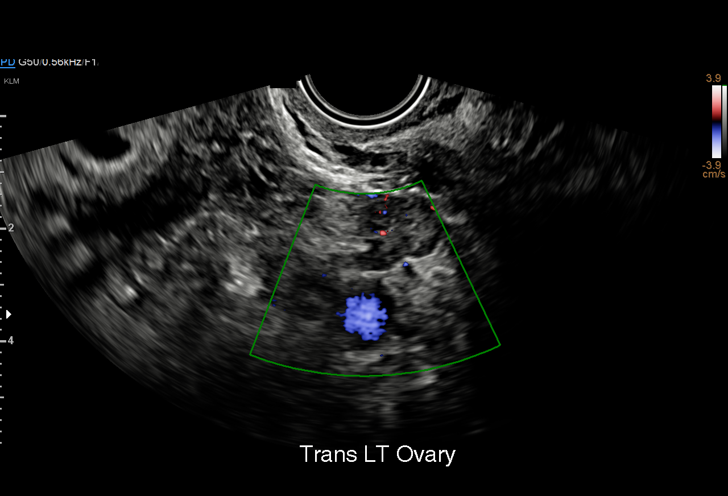
[im 54/54]
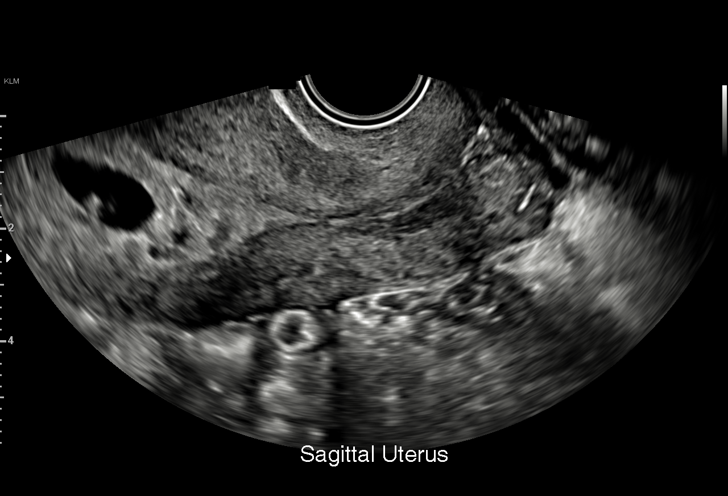

[15 of 28 positions shown; findings below may reference images not displayed]

FINDINGS: Intrauterine gestational sac: Present

Yolk sac:  Present

Embryo:  Present

Cardiac Activity: Present

Heart Rate: 115 bpm

CRL:  3.7 mm   6 w   0 d                  US EDC: 10/03/2021

Subchorionic hemorrhage:  None visualized.

Maternal uterus/adnexae: Within normal limits.
IMPRESSION: Single live intrauterine gestation at 6 weeks 0 days. No acute
abnormality noted.

## 2022-11-11 IMAGING — US US RENAL
1 series · 15 of 25 positions shown · non-contrast
Comparison: Renal ultrasound 03/17/2021

CLINICAL DATA: Left flank pain.

EXAM:
RENAL / URINARY TRACT ULTRASOUND COMPLETE

[Series 1: us renal · 15 of 30 slices shown]
[im 1/30]
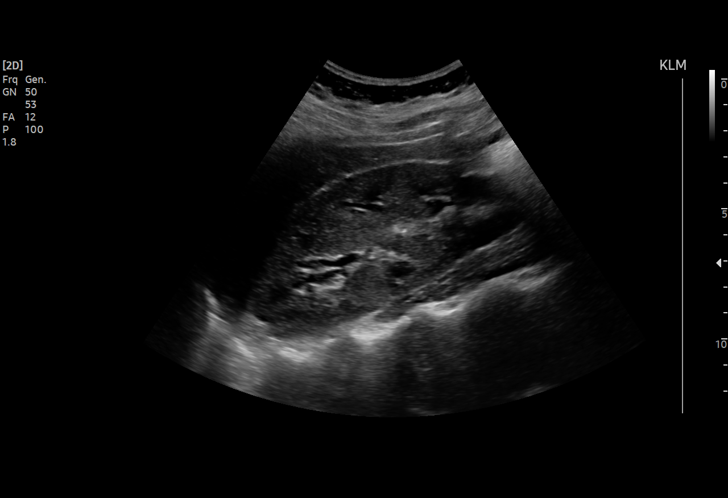
[im 3/30]
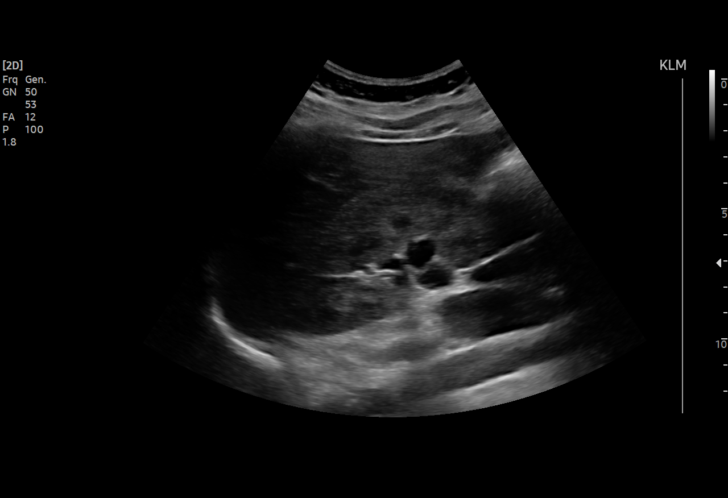
[im 5/30]
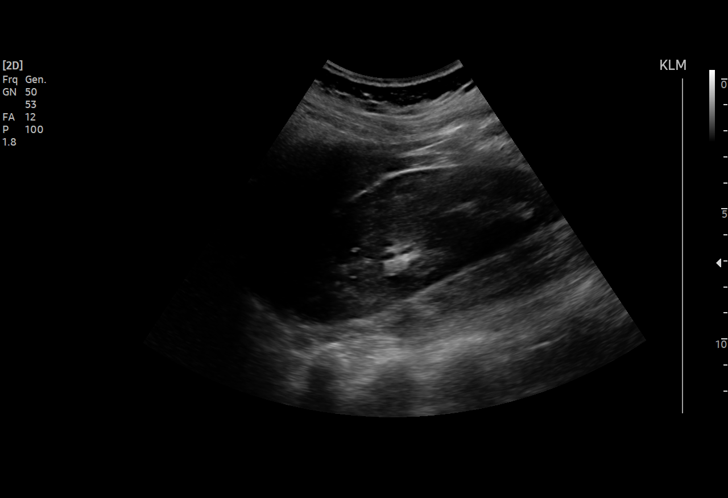
[im 7/30]
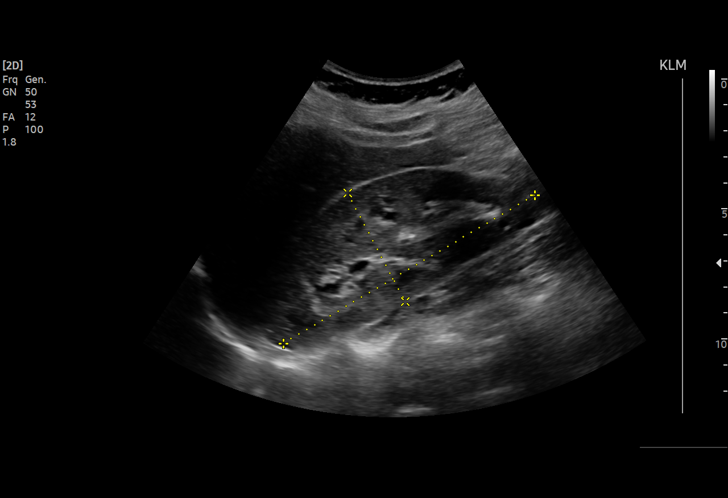
[im 9/30]
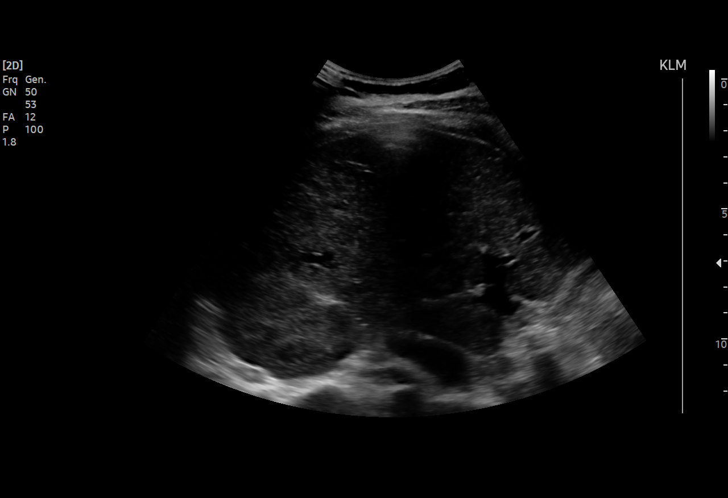
[im 11/30]
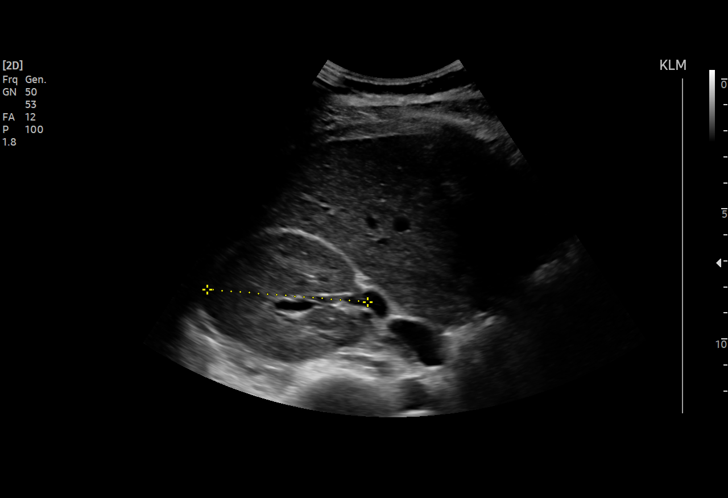
[im 13/30]
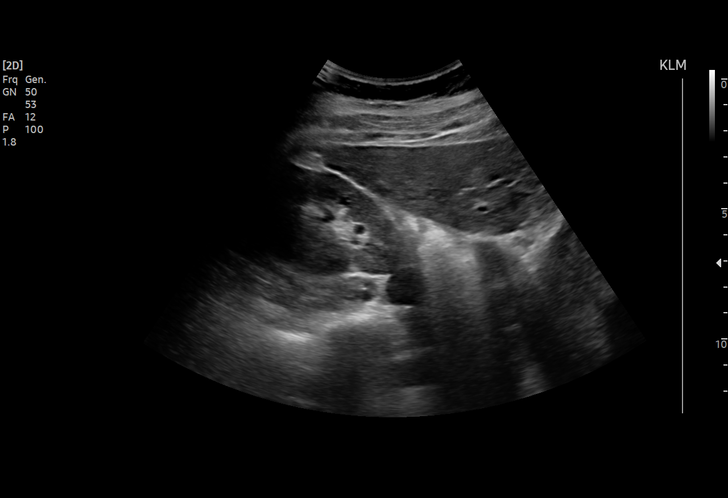
[im 15/30]
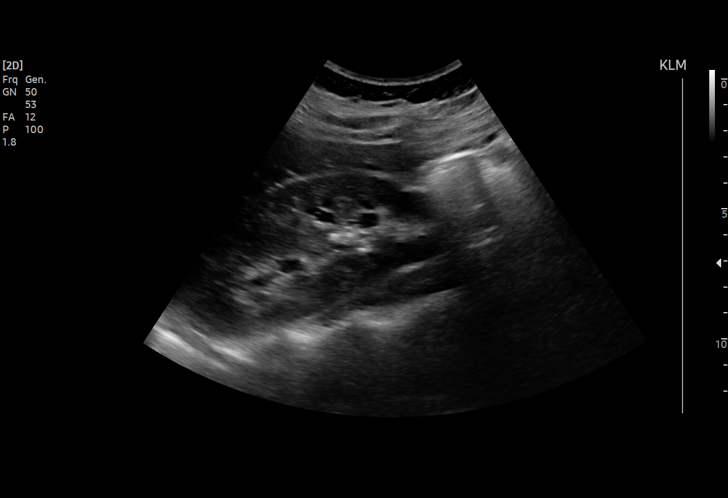
[im 17/30]
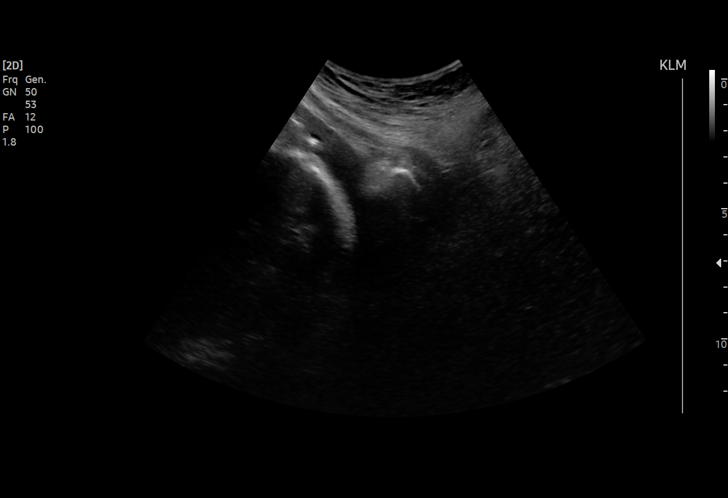
[im 19/30]
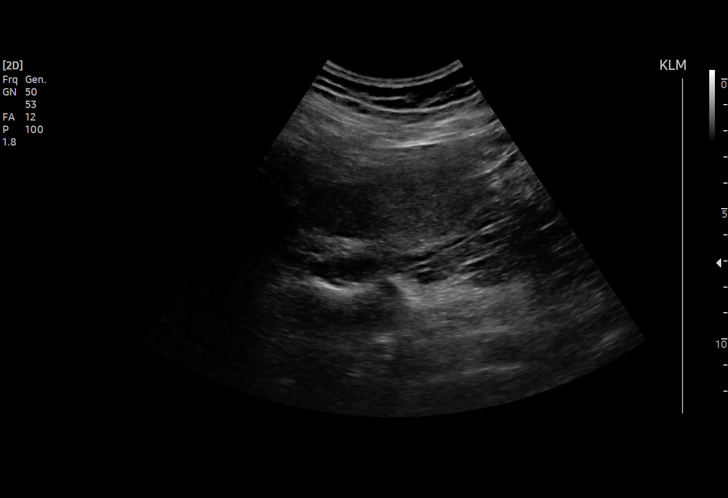
[im 21/30]
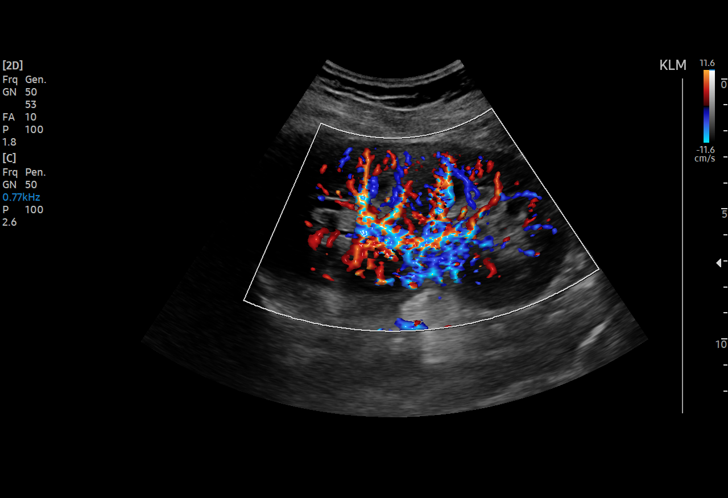
[im 23/30]
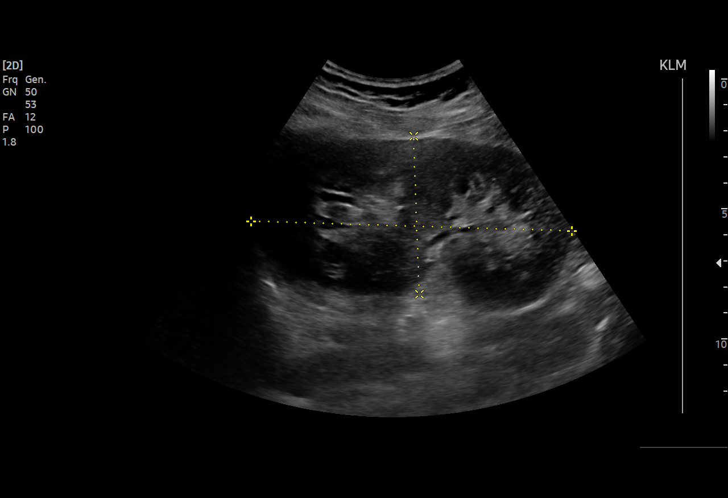
[im 25/30]
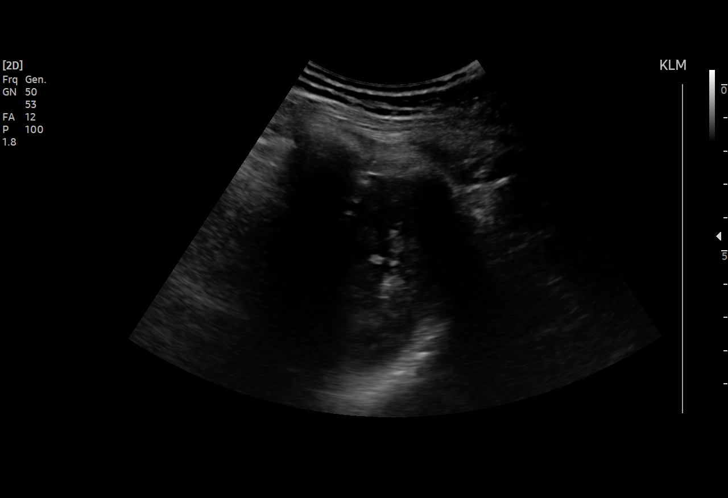
[im 27/30]
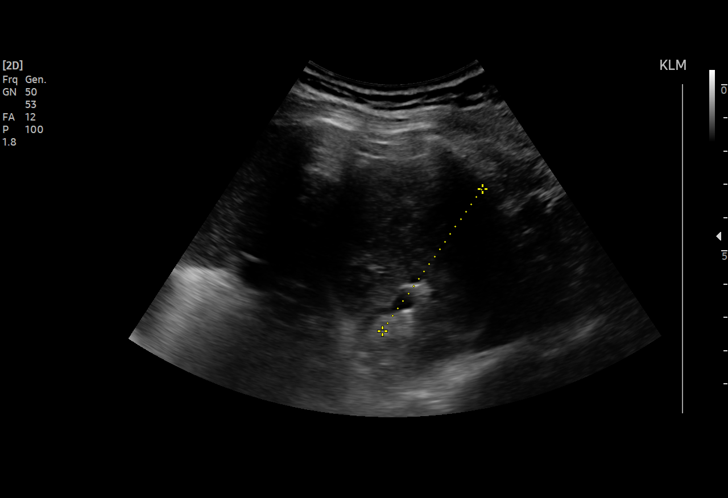
[im 30/30]
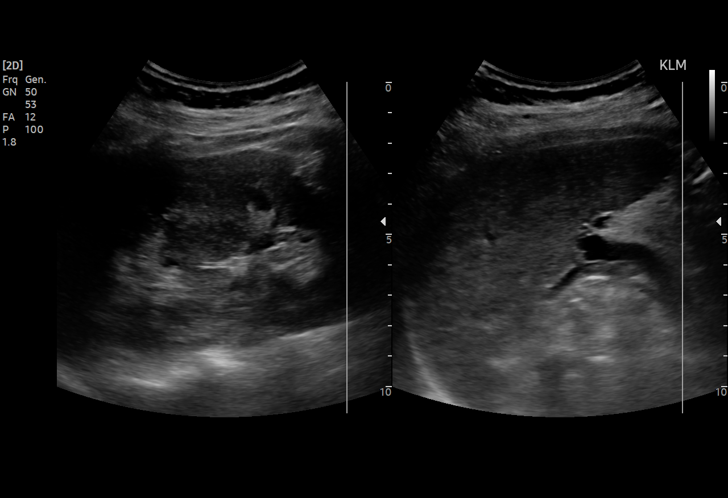

[15 of 25 positions shown; findings below may reference images not displayed]

FINDINGS: Right Kidney:

Renal measurements: 11.2 x 4.7 x 4.2 cm = volume: 170 mL.
Echogenicity within normal limits. No mass or hydronephrosis
visualized.

Left Kidney:

Renal measurements: 12.3 by 6.1 x 5.2 cm = volume: 204 mL.
Echogenicity within normal limits. No mass or hydronephrosis
visualized.

Bladder:

The bladder is empty.

Other:

None.
IMPRESSION: Normal renal ultrasound.

## 2023-02-27 ENCOUNTER — Ambulatory Visit: Payer: Self-pay | Admitting: Family Medicine

## 2023-02-27 DIAGNOSIS — Z7689 Persons encountering health services in other specified circumstances: Secondary | ICD-10-CM

## 2023-03-29 ENCOUNTER — Other Ambulatory Visit: Payer: Self-pay

## 2023-03-29 DIAGNOSIS — R1030 Lower abdominal pain, unspecified: Secondary | ICD-10-CM | POA: Insufficient documentation

## 2023-03-29 DIAGNOSIS — N939 Abnormal uterine and vaginal bleeding, unspecified: Secondary | ICD-10-CM | POA: Insufficient documentation

## 2023-03-29 DIAGNOSIS — D649 Anemia, unspecified: Secondary | ICD-10-CM | POA: Diagnosis not present

## 2023-03-30 ENCOUNTER — Encounter (HOSPITAL_BASED_OUTPATIENT_CLINIC_OR_DEPARTMENT_OTHER): Payer: Self-pay | Admitting: Emergency Medicine

## 2023-03-30 ENCOUNTER — Emergency Department (HOSPITAL_BASED_OUTPATIENT_CLINIC_OR_DEPARTMENT_OTHER)
Admission: EM | Admit: 2023-03-30 | Discharge: 2023-03-30 | Disposition: A | Discharge status: Home / Self Care | Payer: Self-pay | Attending: Emergency Medicine | Admitting: Emergency Medicine

## 2023-03-30 DIAGNOSIS — N939 Abnormal uterine and vaginal bleeding, unspecified: Secondary | ICD-10-CM

## 2023-03-30 LAB — CBC
HCT: 27.7 % — ABNORMAL LOW (ref 36.0–46.0)
Hemoglobin: 9.2 g/dL — ABNORMAL LOW (ref 12.0–15.0)
MCH: 32.5 pg (ref 26.0–34.0)
MCHC: 33.2 g/dL (ref 30.0–36.0)
MCV: 97.9 fL (ref 80.0–100.0)
Platelets: 312 10*3/uL (ref 150–400)
RBC: 2.83 MIL/uL — ABNORMAL LOW (ref 3.87–5.11)
RDW: 12.5 % (ref 11.5–15.5)
WBC: 7.5 10*3/uL (ref 4.0–10.5)
nRBC: 0 % (ref 0.0–0.2)

## 2023-03-30 LAB — HCG, QUANTITATIVE, PREGNANCY: hCG, Beta Chain, Quant, S: 725 m[IU]/mL — ABNORMAL HIGH (ref <5)

## 2023-03-30 NOTE — ED Provider Notes (Signed)
 [place] [facility] AT [facility] Provider Note   CSN: [id number] Arrival date & time: 03/29/23  2355     History  Chief Complaint  Patient presents with   Abdominal Pain   Vaginal Bleeding    Meghan [name] is a 38 y.o. female.  Patient is a 38 year old female presenting with vaginal bleeding and lower abdominal cramping.  She is 3 weeks out from what sounds like a mifepristone induced abortion.  She reports being [redacted] weeks gestation when the medication was given.  She has had persistent vaginal bleeding and passing of clots during the 3 weeks since.  She denies fevers, but does state that she feels "hot and cold".  No alleviating factors.  The history is provided by the patient.       Home Medications Prior to Admission medications   Medication Sig Start Date End Date Taking? Authorizing Provider  acetaminophen  (TYLENOL ) 500 MG tablet Take 500 mg by mouth every 6 (six) hours as needed for mild pain or headache.    [provider]  ferrous sulfate 325 (65 FE) MG tablet Take 325 mg by mouth daily with breakfast.    [provider]  ketorolac  (TORADOL ) 10 MG tablet Take 1 tablet (10 mg total) by mouth every 8 (eight) hours as needed for severe pain. 04/26/22   Onetha Bile, MD  magnesium  gluconate (MAGONATE) 500 MG tablet Take 500 mg by mouth daily.    [provider]  ondansetron  (ZOFRAN ) 4 MG tablet Take 1 tablet (4 mg total) by mouth every 6 (six) hours as needed for nausea or vomiting. 04/26/22   Onetha Bile, MD  oxyCODONE  (OXY IR/ROXICODONE ) 5 MG immediate release tablet Take 1 tablet (5 mg total) by mouth every 4 (four) hours as needed (pain scale 4-7). 10/02/21   Thurman Flores, MD  Prenatal Vit-Fe Fumarate-FA (PRENATAL VITAMINS PO) Take 1 tablet by mouth daily.    [provider]  pyridOXINE (VITAMIN B-6) 50 MG tablet Take 50 mg by mouth 2 times daily at 12 noon and 4 pm.    [provider]   tamsulosin  (FLOMAX ) 0.4 MG CAPS capsule Take 1 capsule (0.4 mg total) by mouth daily. 04/26/22   Countryman, Chase, MD  triamcinolone cream (KENALOG) 0.1 % 1 application Externally Twice a day for 14 days Patient not taking: Reported on 08/22/2021 05/29/20   [provider]      Allergies    Patient has no known allergies.    Review of Systems   Review of Systems  All other systems reviewed and are negative.   Physical Exam Updated Vital Signs BP 117/73   Pulse 77   Temp 98.1 F (36.7 C) (Temporal)   Resp 16   Ht 5\' 6"  (1.676 m)   Wt 63.5 kg   SpO2 99%   Breastfeeding No   BMI 22.60 kg/m  Physical Exam Vitals and nursing note reviewed.  Constitutional:      General: She is not in acute distress.    Appearance: She is well-developed. She is not diaphoretic.  HENT:     Head: Normocephalic and atraumatic.  Cardiovascular:     Rate and Rhythm: Normal rate and regular rhythm.     Heart sounds: No murmur heard.    No friction rub. No gallop.  Pulmonary:     Effort: Pulmonary effort is normal. No respiratory distress.     Breath sounds: Normal breath sounds. No wheezing.  Abdominal:     General:  Bowel sounds are normal. There is no distension.     Palpations: Abdomen is soft.     Tenderness: There is no abdominal tenderness.  Musculoskeletal:        General: Normal range of motion.     Cervical back: Normal range of motion and neck supple.  Skin:    General: Skin is warm and dry.  Neurological:     General: No focal deficit present.     Mental Status: She is alert and oriented to person, place, and time.     ED Results / Procedures / Treatments   Labs (all labs ordered are listed, but only abnormal results are displayed) Labs Reviewed  HCG, QUANTITATIVE, PREGNANCY - Abnormal; Notable for the following components:      Result Value   hCG, Beta Chain, Quant, S 725 (*)    All other components within normal limits  CBC - Abnormal; Notable for the following  components:   RBC 2.83 (*)    Hemoglobin 9.2 (*)    HCT 27.7 (*)    All other components within normal limits  PREGNANCY, URINE    EKG None  Radiology No results found.  Procedures Procedures    Medications Ordered in ED Medications - No data to display  ED Course/ Medical Decision Making/ A&P  Patient is a 38 year old female presenting with vaginal bleeding and lower abdominal cramping 3 weeks status post mifepristone induced abortion.  Patient arrives here with stable vital signs and is afebrile.  She has mild tenderness to the suprapubic region, but no peritoneal signs.  Laboratory studies obtained including CBC and quantitative hCG.  She has no leukocytosis, but hemoglobin is 9.2.  She also has an hCG of 725.  At this point, I feel as though patient will require an ultrasound, however ultrasound is not available here at night.  I have spoken with Dr. Pennie Box who is on-call for her OB/GYN group.  He will make arrangements for her to be followed up in the morning for ultrasound and scheduling of procedures as needed.  Patient to be discharged.  Final Clinical Impression(s) / ED Diagnoses Final diagnoses:  None    Rx / DC Orders ED Discharge Orders     None         Orvilla Blander, MD 03/30/23 0230

## 2023-03-30 NOTE — Discharge Instructions (Signed)
 Follow-up with your OB/GYN today.  I have spoken with Dr. Pennie Box and he will have his office reach out to you this morning.  If you have not heard from them by 10 AM, call the number included in this discharge summary.

## 2023-03-30 NOTE — ED Triage Notes (Signed)
 Patient presents with lower abd pain and vaginal bleeding x 3 weeks. Patient reports "abortion" 3 weeks ago and thinks sx related to same.

## 2023-10-08 ENCOUNTER — Other Ambulatory Visit: Payer: Self-pay | Admitting: Urology

## 2023-10-12 NOTE — Patient Instructions (Signed)
 SURGICAL WAITING ROOM VISITATION  Patients having surgery or a procedure may have no more than 2 support people in the waiting area - these visitors may rotate.    Children under the age of 81 must have an adult with them who is not the patient.  Visitors with respiratory illnesses are discouraged from visiting and should remain at home.  If the patient needs to stay at the hospital during part of their recovery, the visitor guidelines for inpatient rooms apply. Pre-op nurse will coordinate an appropriate time for 1 support person to accompany patient in pre-op.  This support person may not rotate.    Please refer to the [facility] website for the visitor guidelines for Inpatients (after your surgery is over and you are in a regular room).    Your procedure is scheduled on: 10/27/23   Report to [facility] Main Entrance    Report to admitting at 11:15 AM   Call this number if you have problems the morning of surgery [phone]   Do not eat food or drink liquids :After Midnight.          If you have questions, please contact your surgeon's office.   FOLLOW BOWEL PREP AND ANY ADDITIONAL PRE OP INSTRUCTIONS YOU RECEIVED FROM YOUR SURGEON'S OFFICE!!!     Oral Hygiene is also important to reduce your risk of infection.                                    Remember - BRUSH YOUR TEETH THE MORNING OF SURGERY WITH YOUR REGULAR TOOTHPASTE  DENTURES WILL BE REMOVED PRIOR TO SURGERY PLEASE DO NOT APPLY Poly grip OR ADHESIVES!!!   Do NOT smoke after Midnight   Stop all vitamins and herbal supplements 7 days before surgery.   Take these medicines the morning of surgery with A SIP OF WATER: Tylenol   DO NOT TAKE ANY ORAL DIABETIC MEDICATIONS DAY OF YOUR SURGERY  Bring CPAP mask and tubing day of surgery.                              You may not have any metal on your body including hair pins, jewelry, and body piercing             Do not wear make-up, lotions, powders,  perfumes, or deodorant  Do not wear nail polish including gel and S&S, artificial/acrylic nails, or any other type of covering on natural nails including finger and toenails. If you have artificial nails, gel coating, etc. that needs to be removed by a nail salon please have this removed prior to surgery or surgery may need to be canceled/ delayed if the surgeon/ anesthesia feels like they are unable to be safely monitored.   Do not shave  48 hours prior to surgery.    Do not bring valuables to the hospital.  IS NOT             RESPONSIBLE   FOR VALUABLES.   Contacts, glasses, dentures or bridgework may not be worn into surgery.  DO NOT BRING YOUR HOME MEDICATIONS TO THE HOSPITAL. PHARMACY WILL DISPENSE MEDICATIONS LISTED ON YOUR MEDICATION LIST TO YOU DURING YOUR ADMISSION IN THE HOSPITAL!    Patients discharged on the day of surgery will not be allowed to drive home.  Someone NEEDS to stay with you for the  first 24 hours after anesthesia.   Special Instructions: Bring a copy of your healthcare power of attorney and living will documents the day of surgery if you haven't scanned them before.              Please read over the following fact sheets you were given: IF YOU HAVE QUESTIONS ABOUT YOUR PRE-OP INSTRUCTIONS PLEASE CALL [phone].   If you received a COVID test during your pre-op visit  it is requested that you wear a mask when out in public, stay away from anyone that may not be feeling well and notify your surgeon if you develop symptoms. If you test positive for Covid or have been in contact with anyone that has tested positive in the last 10 days please notify you surgeon.    [facility] - Preparing for Surgery Before surgery, you can play an important role.  Because skin is not sterile, your skin needs to be as free of germs as possible.  You can reduce the number of germs on your skin by washing with CHG (chlorahexidine gluconate) soap before surgery.  CHG  is an antiseptic cleaner which kills germs and bonds with the skin to continue killing germs even after washing. Please DO NOT use if you have an allergy to CHG or antibacterial soaps.  If your skin becomes reddened/irritated stop using the CHG and inform your nurse when you arrive at Short Stay. Do not shave (including legs and underarms) for at least 48 hours prior to the first CHG shower.  You may shave your face/neck.  Please follow these instructions carefully:  1.  Shower with CHG Soap the night before surgery and the  morning of surgery.  2.  If you choose to wash your hair, wash your hair first as usual with your normal  shampoo.  3.  After you shampoo, rinse your hair and body thoroughly to remove the shampoo.                             4.  Use CHG as you would any other liquid soap.  You can apply chg directly to the skin and wash.  Gently with a scrungie or clean washcloth.  5.  Apply the CHG Soap to your body ONLY FROM THE NECK DOWN.   Do   not use on face/ open                           Wound or open sores. Avoid contact with eyes, ears mouth and   genitals (private parts).                       Wash face,  Genitals (private parts) with your normal soap.             6.  Wash thoroughly, paying special attention to the area where your    surgery  will be performed.  7.  Thoroughly rinse your body with warm water from the neck down.  8.  DO NOT shower/wash with your normal soap after using and rinsing off the CHG Soap.                9.  Pat yourself dry with a clean towel.            10.  Wear clean pajamas.  11.  Place clean sheets on your bed the night of your first shower and do not  sleep with pets. Day of Surgery : Do not apply any lotions/deodorants the morning of surgery.  Please wear clean clothes to the hospital/surgery center.  FAILURE TO FOLLOW THESE INSTRUCTIONS MAY RESULT IN THE CANCELLATION OF YOUR SURGERY  PATIENT  SIGNATURE_________________________________  NURSE SIGNATURE__________________________________  ________________________________________________________________________

## 2023-10-13 ENCOUNTER — Encounter (HOSPITAL_COMMUNITY)
Admission: RE | Admit: 2023-10-13 | Discharge: 2023-10-13 | Disposition: A | Discharge status: Home / Self Care | Source: Ambulatory Visit | Attending: Anesthesiology | Admitting: Anesthesiology

## 2023-10-13 DIAGNOSIS — D649 Anemia, unspecified: Secondary | ICD-10-CM

## 2023-10-16 NOTE — Patient Instructions (Addendum)
 SURGICAL WAITING ROOM VISITATION Patients having surgery or a procedure may have no more than 2 support people in the waiting area - these visitors may rotate in the visitor waiting room.   Due to an increase in RSV and influenza rates and associated hospitalizations, children ages 63 and under may not visit patients in [facility] hospitals. If the patient needs to stay at the hospital during part of their recovery, the visitor guidelines for inpatient rooms apply.  PRE-OP VISITATION  Pre-op nurse will coordinate an appropriate time for 1 support person to accompany the patient in pre-op.  This support person may not rotate.  This visitor will be contacted when the time is appropriate for the visitor to come back in the pre-op area.  Please refer to the [facility] website for the visitor guidelines for Inpatients (after your surgery is over and you are in a regular room).  You are not required to quarantine at this time prior to your surgery. However, you must do this: Hand Hygiene often Do NOT share personal items Notify your provider if you are in close contact with someone who has COVID or you develop fever 100.4 or greater, new onset of sneezing, cough, sore throat, shortness of breath or body aches.  If you test positive for Covid or have been in contact with anyone that has tested positive in the last 10 days please notify you surgeon.    Your procedure is scheduled on: 10/27/23   Report to [facility] Main Entrance: [address] entrance where the [employer] is available.   Report to admitting at: 11:15 AM  Call this number if you have any questions or problems the morning of surgery [phone]  FOLLOW ANY ADDITIONAL PRE OP INSTRUCTIONS YOU RECEIVED FROM YOUR SURGEON'S OFFICE!!!  Do not eat food after Midnight the night prior to your surgery/procedure.  After Midnight you may have the following liquids until : 10:30 AM DAY OF SURGERY  Clear Liquid Diet Water Black  Coffee (sugar ok, NO MILK/CREAM OR CREAMERS)  Tea (sugar ok, NO MILK/CREAM OR CREAMERS) regular and decaf                             Plain Jell-O  with no fruit (NO RED)                                           Fruit ices (not with fruit pulp, NO RED)                                     Popsicles (NO RED)                                                                  Juice: NO CITRUS JUICES: only apple, WHITE grape, WHITE cranberry Sports drinks like Gatorade or Powerade (NO RED)   Oral Hygiene is also important to reduce your risk of infection.        Remember - BRUSH YOUR TEETH THE MORNING OF SURGERY WITH YOUR REGULAR  TOOTHPASTE  Do NOT smoke after Midnight the night before surgery.  STOP TAKING all Vitamins, Herbs and supplements 1 week before your surgery.   Take ONLY these medicines the morning of surgery with A SIP OF WATER: NONE.                  You may not have any metal on your body including hair pins, jewelry, and body piercing  Do not wear make-up, lotions, powders, perfumes / cologne, or deodorant  Do not wear nail polish including gel and S&S, artificial / acrylic nails, or any other type of covering on natural nails including finger and toenails. If you have artificial nails, gel coating, etc., that needs to be removed by a nail salon, Please have this removed prior to surgery. Not doing so may mean that your surgery could be cancelled or delayed if the Surgeon or anesthesia staff feels like they are unable to monitor you safely.   Do not shave 48 hours prior to surgery to avoid nicks in your skin which may contribute to postoperative infections.   Contacts, Hearing Aids, dentures or bridgework may not be worn into surgery. DENTURES WILL BE REMOVED PRIOR TO SURGERY PLEASE DO NOT APPLY Poly grip OR ADHESIVES!!!  You may bring a small overnight bag with you on the day of surgery, only pack items that are not valuable. [facility] IS NOT RESPONSIBLE   FOR VALUABLES THAT  ARE LOST OR STOLEN.   Patients discharged on the day of surgery will not be allowed to drive home.  Someone NEEDS to stay with you for the first 24 hours after anesthesia.  Do not bring your home medications to the hospital. The Pharmacy will dispense medications listed on your medication list to you during your admission in the Hospital.  Special Instructions: Bring a copy of your healthcare power of attorney and living will documents the day of surgery, if you wish to have them scanned into your [facility]- EPIC  Please read over the following fact sheets you were given: IF YOU HAVE QUESTIONS ABOUT YOUR PRE-OP INSTRUCTIONS, PLEASE CALL [phone]  [facility] - Preparing for Surgery Before surgery, you can play an important role.  Because skin is not sterile, your skin needs to be as free of germs as possible.  You can reduce the number of germs on your skin by washing with CHG (chlorahexidine gluconate) soap before surgery.  CHG is an antiseptic cleaner which kills germs and bonds with the skin to continue killing germs even after washing. Please DO NOT use if you have an allergy to CHG or antibacterial soaps.  If your skin becomes reddened/irritated stop using the CHG and inform your nurse when you arrive at Short Stay. Do not shave (including legs and underarms) for at least 48 hours prior to the first CHG shower.  You may shave your face/neck.  Please follow these instructions carefully:  1.  Shower with CHG Soap the night before surgery and the  morning of surgery.  2.  If you choose to wash your hair, wash your hair first as usual with your normal  shampoo.  3.  After you shampoo, rinse your hair and body thoroughly to remove the shampoo.                             4.  Use CHG as you would any other liquid soap.  You can apply chg  directly to the skin and wash.  Gently with a scrungie or clean washcloth.  5.  Apply the CHG Soap to your body ONLY FROM THE NECK DOWN.   Do  not use on face/ open                           Wound or open sores. Avoid contact with eyes, ears mouth and genitals (private parts).                       Wash face,  Genitals (private parts) with your normal soap.             6.  Wash thoroughly, paying special attention to the area where your  surgery  will be performed.  7.  Thoroughly rinse your body with warm water from the neck down.  8.  DO NOT shower/wash with your normal soap after using and rinsing off the CHG Soap.            9.  Pat yourself dry with a clean towel.            10.  Wear clean pajamas.            11.  Place clean sheets on your bed the night of your first shower and do not  sleep with pets.  ON THE DAY OF SURGERY : Do not apply any lotions/deodorants the morning of surgery.  Please wear clean clothes to the hospital/surgery center.     FAILURE TO FOLLOW THESE INSTRUCTIONS MAY RESULT IN THE CANCELLATION OF YOUR SURGERY  PATIENT SIGNATURE_________________________________  NURSE SIGNATURE__________________________________  ________________________________________________________________________

## 2023-10-20 ENCOUNTER — Encounter (HOSPITAL_COMMUNITY)
Admission: RE | Admit: 2023-10-20 | Discharge: 2023-10-20 | Disposition: A | Discharge status: Home / Self Care | Source: Ambulatory Visit | Attending: Anesthesiology | Admitting: Anesthesiology

## 2023-10-20 DIAGNOSIS — Z01818 Encounter for other preprocedural examination: Secondary | ICD-10-CM

## 2023-10-20 NOTE — Progress Notes (Signed)
 Pt. Did not show for PST appointment,when she was contacted by RN,she said that she cancelled her appointment.

## 2023-10-21 ENCOUNTER — Encounter (HOSPITAL_COMMUNITY): Payer: Self-pay | Admitting: Urology

## 2023-10-21 NOTE — Progress Notes (Signed)
 Attempted to obtain medical history via telephone, unable to reach at this time. HIPAA compliant voicemail message left requesting return call to pre surgical testing department.

## 2023-10-21 NOTE — Progress Notes (Signed)
 For Anesthesia: PCP -  Cardiologist -   Bowel Prep reminder:  Chest x-ray - greater than 1 year EKG - greater than 1 year Stress Test -  ECHO -  Cardiac Cath -  Pacemaker/ICD device last checked: Pacemaker orders received: Device Rep notified:  Spinal Cord Stimulator:  Sleep Study -  CPAP -   Fasting Blood Sugar -  Checks Blood Sugar _____ times a day Date and result of last Hgb A1c-  Last dose of GLP1 agonist-  GLP1 instructions: Hold 7 days prior to schedule (Hold 24 hours-daily)   Last dose of SGLT-2 inhibitors-  SGLT-2 instructions: Hold 72 hours prior to surgery  Blood Thinner Instructions: Aspirin Instructions: Last Dose:  Activity level: Can go up a flight of stairs and activities of daily living without stopping and without chest pain and/or shortness of breath   Able to exercise without chest pain and/or shortness of breath   Unable to go up a flight of stairs without chest pain and/or shortness of breath     Anesthesia review:   Patient denies shortness of breath, fever, cough and chest pain at PAT appointment   Patient verbalized understanding of instructions that were reviewed over the telephone.

## 2023-10-27 ENCOUNTER — Encounter (HOSPITAL_COMMUNITY): Admission: RE | Payer: Self-pay | Source: Home / Self Care

## 2023-10-27 ENCOUNTER — Ambulatory Visit (HOSPITAL_COMMUNITY): Admission: RE | Admit: 2023-10-27 | Source: Home / Self Care | Admitting: Urology

## 2023-10-27 HISTORY — DX: Anemia, unspecified: D64.9

## 2023-10-27 SURGERY — CYSTOSCOPY/URETEROSCOPY/HOLMIUM LASER/STENT PLACEMENT
Anesthesia: General | Laterality: Right

## 2023-12-21 ENCOUNTER — Ambulatory Visit (HOSPITAL_COMMUNITY): Admission: RE | Admit: 2023-12-21 | Source: Home / Self Care | Admitting: Obstetrics and Gynecology

## 2023-12-21 ENCOUNTER — Encounter (HOSPITAL_COMMUNITY): Admission: RE | Payer: Self-pay | Source: Home / Self Care

## 2023-12-21 SURGERY — DILATION AND EVACUATION, UTERUS
Anesthesia: Choice
